# Patient Record
Sex: Female | Born: 1935 | Race: White | Hispanic: No | Marital: Married | Smoking: Never smoker
Health system: Southern US, Community
[De-identification: ages and names within clinical notes are randomized; demographics above are authoritative.]

## PROBLEM LIST (undated history)

## (undated) DIAGNOSIS — F329 Major depressive disorder, single episode, unspecified: Secondary | ICD-10-CM

## (undated) DIAGNOSIS — C50919 Malignant neoplasm of unspecified site of unspecified female breast: Secondary | ICD-10-CM

## (undated) DIAGNOSIS — G309 Alzheimer's disease, unspecified: Secondary | ICD-10-CM

## (undated) DIAGNOSIS — F0390 Unspecified dementia without behavioral disturbance: Secondary | ICD-10-CM

## (undated) DIAGNOSIS — C801 Malignant (primary) neoplasm, unspecified: Secondary | ICD-10-CM

## (undated) DIAGNOSIS — F0280 Dementia in other diseases classified elsewhere without behavioral disturbance: Secondary | ICD-10-CM

## (undated) DIAGNOSIS — F32A Depression, unspecified: Secondary | ICD-10-CM

## (undated) DIAGNOSIS — F419 Anxiety disorder, unspecified: Secondary | ICD-10-CM

## (undated) HISTORY — PX: NO PAST SURGERIES: SHX2092

---

## 2015-04-17 ENCOUNTER — Encounter: Payer: Self-pay | Admitting: Intensive Care

## 2015-04-17 ENCOUNTER — Emergency Department
Admission: EM | Admit: 2015-04-17 | Discharge: 2015-04-17 | Disposition: A | Discharge status: Home / Self Care | Payer: Medicare Other | Attending: Emergency Medicine | Admitting: Emergency Medicine

## 2015-04-17 DIAGNOSIS — Y9389 Activity, other specified: Secondary | ICD-10-CM | POA: Diagnosis not present

## 2015-04-17 DIAGNOSIS — F0390 Unspecified dementia without behavioral disturbance: Secondary | ICD-10-CM | POA: Insufficient documentation

## 2015-04-17 DIAGNOSIS — S300XXA Contusion of lower back and pelvis, initial encounter: Secondary | ICD-10-CM | POA: Diagnosis not present

## 2015-04-17 DIAGNOSIS — S3992XA Unspecified injury of lower back, initial encounter: Secondary | ICD-10-CM | POA: Diagnosis present

## 2015-04-17 DIAGNOSIS — W19XXXA Unspecified fall, initial encounter: Secondary | ICD-10-CM

## 2015-04-17 DIAGNOSIS — R296 Repeated falls: Secondary | ICD-10-CM | POA: Diagnosis not present

## 2015-04-17 DIAGNOSIS — Y9289 Other specified places as the place of occurrence of the external cause: Secondary | ICD-10-CM | POA: Insufficient documentation

## 2015-04-17 DIAGNOSIS — Y998 Other external cause status: Secondary | ICD-10-CM | POA: Insufficient documentation

## 2015-04-17 DIAGNOSIS — W1839XA Other fall on same level, initial encounter: Secondary | ICD-10-CM | POA: Diagnosis not present

## 2015-04-17 DIAGNOSIS — I10 Essential (primary) hypertension: Secondary | ICD-10-CM | POA: Insufficient documentation

## 2015-04-17 HISTORY — DX: Alzheimer's disease, unspecified: G30.9

## 2015-04-17 HISTORY — DX: Depression, unspecified: F32.A

## 2015-04-17 HISTORY — DX: Malignant neoplasm of unspecified site of unspecified female breast: C50.919

## 2015-04-17 HISTORY — DX: Major depressive disorder, single episode, unspecified: F32.9

## 2015-04-17 HISTORY — DX: Unspecified dementia, unspecified severity, without behavioral disturbance, psychotic disturbance, mood disturbance, and anxiety: F03.90

## 2015-04-17 HISTORY — DX: Dementia in other diseases classified elsewhere, unspecified severity, without behavioral disturbance, psychotic disturbance, mood disturbance, and anxiety: F02.80

## 2015-04-17 HISTORY — DX: Malignant (primary) neoplasm, unspecified: C80.1

## 2015-04-17 HISTORY — DX: Anxiety disorder, unspecified: F41.9

## 2015-04-17 LAB — GLUCOSE, CAPILLARY: Glucose-Capillary: 126 mg/dL — ABNORMAL HIGH (ref 65–99)

## 2015-04-17 NOTE — ED Notes (Signed)
Patient arrived by EMS due to unwitnessed fall stated by staff. Staff found patient on floor. Patient has HX of dementia. Per staff patient is not on blood thinners

## 2015-04-17 NOTE — ED Notes (Signed)
POC CBG 126

## 2015-04-17 NOTE — ED Notes (Signed)
PAtient has HX of dementia. NAD noted. Patient ambulated in room with assistance (RN and MD) with no problems. Staff from [employer] reports finding patient on floor with unwitnessed fall. Patient does not take blood thinners. No signs of injury reported

## 2015-04-17 NOTE — Discharge Instructions (Signed)
It appears he had a fall today without any serious injury.  You have been seen in the Emergency Department (ED) today for a fall.  Your work up does not show any concerning injuries.    Please follow up with your doctor regarding today's Emergency Department (ED) visit and your recent fall.    Return to the ED if you have any headache, confusion, slurred speech, weakness/numbness of any arm or leg, or any increased pain.   Fall Prevention in Hospitals, Adult As a hospital patient, your condition and the treatments you receive can increase your risk for falls. Some additional risk factors for falls in a hospital include:  Being in an unfamiliar environment.  Being on bed rest.  Your surgery.  Taking certain medicines.  Your tubing requirements, such as intravenous (IV) therapy or catheters. It is important that you learn how to decrease fall risks while at the hospital. Below are important tips that can help prevent falls. SAFETY TIPS FOR PREVENTING FALLS Talk about your risk of falling.  Ask your health care provider why you are at risk for falling. Is it your medicine, illness, tubing placement, or something else?  Make a plan with your health care provider to keep you safe from falls.  Ask your health care provider or pharmacist about side effects of your medicines. Some medicines can make you dizzy or affect your coordination. Ask for help.  Ask for help before getting out of bed. You may need to press your call button.  Ask for assistance in getting safely to the toilet.  Ask for a walker or cane to be put at your bedside. Ask that most of the side rails on your bed be placed up before your health care provider leaves the room.  Ask family or friends to sit with you.  Ask for things that are out of your reach, such as your glasses, hearing aids, telephone, bedside table, or call button. Follow these tips to avoid falling:  Stay lying or seated, rather than standing,  while waiting for help.  Wear rubber-soled slippers or shoes whenever you walk in the hospital.  Avoid quick, sudden movements.  Change positions slowly.  Sit on the side of your bed before standing.  Stand up slowly and wait before you start to walk.  Let your health care provider know if there is a spill on the floor.  Pay careful attention to the medical equipment, electrical cords, and tubes around you.  When you need help, use your call button by your bed or in the bathroom. Wait for one of your health care providers to help you.  If you feel dizzy or unsure of your footing, return to bed and wait for assistance.  Avoid being distracted by the TV, telephone, or another person in your room.  Do not lean or support yourself on rolling objects, such as IV poles or bedside tables.   This information is not intended to replace advice given to you by your health care provider. Make sure you discuss any questions you have with your health care provider.   Document Released: 06/17/2000 Document Revised: 07/11/2014 Document Reviewed: 02/26/2012 Elsevier Interactive Patient Education [employer].

## 2015-04-17 NOTE — ED Provider Notes (Signed)
[facility] Emergency Department Provider Note REMINDER - THIS NOTE IS NOT A FINAL MEDICAL RECORD UNTIL IT IS SIGNED. UNTIL THEN, THE CONTENT BELOW MAY REFLECT INFORMATION FROM A DOCUMENTATION TEMPLATE, NOT THE ACTUAL PATIENT VISIT. ____________________________________________  Time seen: Approximately 1:44 PM  I have reviewed the triage vital signs and the nursing notes.   HISTORY  Chief Complaint Fall  Review of systems and history are limited due to patient's severe dementia  HPI Catherine Frazier is a 81 y.o. female with a history of some very severe dementia, hypertension, occasional anxiety, and frequent falls.  History is provided by EMS as well as discussed with Olivia Mackie, one of the nurses aides who found her at [place].  The patient was found sitting next to her couch on the floor today. EMS reports it would appear that she may have slid off the couch, the patient seems to vaguely recall possibly sliding off the couch. The patient initially complained of having some discomfort in her back when I try to stand her up which prompted a call EMS.  The patient's son is present at bedside with me, he reports his mother has severe dementia the point that time she does not even remember her own name. She walks with a walker. He reports that she seems to be in her normal state at this time, and that is acting appropriately for her dementia.  No report of any high fall, no report of any loss of consciousness, no recent fevers, or other concerns reported by the patient.   Past Medical History  Diagnosis Date  . Dementia   . Anxiety   . Cancer ([place])   . Breast cancer ([place])   . Alzheimer's dementia   . Depression     There are no active problems to display for this patient.   History reviewed. No pertinent past surgical history.  No current outpatient prescriptions on file.  Allergies Review of patient's allergies indicates no known  allergies.  History reviewed. No pertinent family history.  Social History Social History  Substance Use Topics  . Smoking status: Never Smoker   . Smokeless tobacco: Never Used  . Alcohol Use: No    Review of Systems severely limited due to dementia, the patient does deny pain or discomfort at this time. She reports "I am an old lady", but otherwise no significant statements from the patient or concerns on review of system.   Patient's son reports that the patient does use a walker and occasionally falls, is not aware of any recent concerns. Again she is acting very normally. The patient does specifically deny any headache, chest pain, or pain in her arms or legs.  ____________________________________________   PHYSICAL EXAM:  VITAL SIGNS: ED Triage Vitals  Enc Vitals Group     BP 04/17/15 1327 141/59 mmHg     Pulse Rate 04/17/15 1327 76     Resp 04/17/15 1327 23     Temp 04/17/15 1327 98 F (36.7 C)     Temp Source 04/17/15 1327 Oral     SpO2 04/17/15 1327 96 %     Weight 04/17/15 1327 166 lb 9.6 oz (75.569 kg)     Height 04/17/15 1327 5\' 7"  (1.702 m)     Head Cir --      Peak Flow --      Pain Score --      Pain Loc --      Pain Edu? --  Excl. in [place]? --    Constitutional: Alert and  disoriented . Well appearing and in no acute distress. sitting upright, in no distress.  Eyes: Conjunctivae are normal. PERRL. EOMI. Head: Atraumatic. Nose: No congestion/rhinnorhea. Mouth/Throat: Mucous membranes are moist.  Oropharynx non-erythematous. Neck: No stridor.  No midline cervical tenderness. No deformities patient does have significant lordosis. Cardiovascular: Normal rate, regular rhythm. Grossly normal heart sounds.  Good peripheral circulation. Respiratory: Normal respiratory effort.  No retractions. Lungs CTAB. Gastrointestinal: Soft and nontender. No distention. No abdominal bruits. No CVA tenderness. Musculoskeletal: No lower extremity tenderness nor edema.  No  joint effusions. no pain in the legs or hips with axial loading, good range of motion of lower extremities without tenderness. No evidence of injury the upper extremities.  Neurologic:  Normal speech and language. No gross focal neurologic deficits are appreciated.  moves all extremities well. Patient is able to stand without pain, rotator cuff the bed and walks with assistance without discomfort or pain. in:  Skin is warm, dry and intact. No rash noted. presented very small ecchymosis noted over the left posterior paraspinous region without laceration.  Psychiatric: Mood and affect are normal. Speech and behavior are normal.  ____________________________________________   LABS (all labs ordered are listed, but only abnormal results are displayed)  Labs Reviewed  GLUCOSE, CAPILLARY - Abnormal; Notable for the following:    Glucose-Capillary 126 (*)    All other components within normal limits  CBG MONITORING, ED   ____________________________________________  EKG  ED ECG REPORT I, Camiya Vinal, the attending physician, personally viewed and interpreted this ECG.  Date: 04/17/2015 EKG Time: 1340 Rate: 75 Rhythm: normal sinus rhythm QRS Axis: normal Intervals: normal ST/T Wave abnormalities: normal Conduction Disutrbances: voltage criteria for LVH Narrative Interpretation: Normal sinus rhythm, possible LVH, no ischemic changes  ____________________________________________  RADIOLOGY  No indication for head CT at this time. I did discuss imaging studies and head CT with the patient's son, there is no evidence of trauma. He reports that she seems to be acting fine, she is not on any anticoagulants per my review, and without any evidence of head trauma, report a headache, and a reassuring neurologic exam I see no reason for CT of that at this time.  ____________________________________________   PROCEDURES  Procedure(s) performed: None  Critical Care performed:  No  ____________________________________________   INITIAL IMPRESSION / ASSESSMENT AND PLAN / ED COURSE  Pertinent labs & imaging results that were available during my care of the patient were reviewed by me and considered in my medical decision making (see chart for details).  Patient had a fall. No evidence of apparent injury except for a slight contusion over the left back. There is no cervical or thoracic tenderness. She is able to ambulate well without discomfort. Her vital signs are normal, no indication for CT head at this time, her EKG is normal. I discussed with the patient's son who is her next of kin, he advises that he is comfortable taking the patient back home and if any concerns arise he'll bring her back for further evaluation. I think this is very reasonable, likely this represents mechanical fall without any significant injury.  ____________________________________________   FINAL CLINICAL IMPRESSION(S) / ED DIAGNOSES  Final diagnoses:  Fall from standing, initial encounter      Delman Kitten, MD 04/17/15 1526

## 2015-04-30 ENCOUNTER — Emergency Department: Payer: Medicare Other

## 2015-04-30 ENCOUNTER — Encounter: Payer: Self-pay | Admitting: Emergency Medicine

## 2015-04-30 ENCOUNTER — Emergency Department
Admission: EM | Admit: 2015-04-30 | Discharge: 2015-04-30 | Disposition: A | Discharge status: Home / Self Care | Payer: Medicare Other | Attending: Emergency Medicine | Admitting: Emergency Medicine

## 2015-04-30 DIAGNOSIS — G309 Alzheimer's disease, unspecified: Secondary | ICD-10-CM | POA: Insufficient documentation

## 2015-04-30 DIAGNOSIS — S0083XA Contusion of other part of head, initial encounter: Secondary | ICD-10-CM | POA: Insufficient documentation

## 2015-04-30 DIAGNOSIS — Y9289 Other specified places as the place of occurrence of the external cause: Secondary | ICD-10-CM | POA: Insufficient documentation

## 2015-04-30 DIAGNOSIS — W19XXXA Unspecified fall, initial encounter: Secondary | ICD-10-CM

## 2015-04-30 DIAGNOSIS — F0280 Dementia in other diseases classified elsewhere without behavioral disturbance: Secondary | ICD-10-CM | POA: Insufficient documentation

## 2015-04-30 DIAGNOSIS — S0990XA Unspecified injury of head, initial encounter: Secondary | ICD-10-CM | POA: Diagnosis present

## 2015-04-30 DIAGNOSIS — Z79899 Other long term (current) drug therapy: Secondary | ICD-10-CM | POA: Diagnosis not present

## 2015-04-30 DIAGNOSIS — Y9389 Activity, other specified: Secondary | ICD-10-CM | POA: Insufficient documentation

## 2015-04-30 DIAGNOSIS — W01198A Fall on same level from slipping, tripping and stumbling with subsequent striking against other object, initial encounter: Secondary | ICD-10-CM | POA: Diagnosis not present

## 2015-04-30 DIAGNOSIS — S0081XA Abrasion of other part of head, initial encounter: Secondary | ICD-10-CM | POA: Diagnosis not present

## 2015-04-30 DIAGNOSIS — Y998 Other external cause status: Secondary | ICD-10-CM | POA: Diagnosis not present

## 2015-04-30 NOTE — Discharge Instructions (Signed)
Fall Prevention in Hospitals, Adult As a hospital patient, your condition and the treatments you receive can increase your risk for falls. Some additional risk factors for falls in a hospital include:  Being in an unfamiliar environment.  Being on bed rest.  Your surgery.  Taking certain medicines.  Your tubing requirements, such as intravenous (IV) therapy or catheters. It is important that you learn how to decrease fall risks while at the hospital. Below are important tips that can help prevent falls. SAFETY TIPS FOR PREVENTING FALLS Talk about your risk of falling.  Ask your health care provider why you are at risk for falling. Is it your medicine, illness, tubing placement, or something else?  Make a plan with your health care provider to keep you safe from falls.  Ask your health care provider or pharmacist about side effects of your medicines. Some medicines can make you dizzy or affect your coordination. Ask for help.  Ask for help before getting out of bed. You may need to press your call button.  Ask for assistance in getting safely to the toilet.  Ask for a walker or cane to be put at your bedside. Ask that most of the side rails on your bed be placed up before your health care provider leaves the room.  Ask family or friends to sit with you.  Ask for things that are out of your reach, such as your glasses, hearing aids, telephone, bedside table, or call button. Follow these tips to avoid falling:  Stay lying or seated, rather than standing, while waiting for help.  Wear rubber-soled slippers or shoes whenever you walk in the hospital.  Avoid quick, sudden movements.  Change positions slowly.  Sit on the side of your bed before standing.  Stand up slowly and wait before you start to walk.  Let your health care provider know if there is a spill on the floor.  Pay careful attention to the medical equipment, electrical cords, and tubes around you.  When you  need help, use your call button by your bed or in the bathroom. Wait for one of your health care providers to help you.  If you feel dizzy or unsure of your footing, return to bed and wait for assistance.  Avoid being distracted by the TV, telephone, or another person in your room.  Do not lean or support yourself on rolling objects, such as IV poles or bedside tables.   This information is not intended to replace advice given to you by your health care provider. Make sure you discuss any questions you have with your health care provider.   Document Released: 06/17/2000 Document Revised: 07/11/2014 Document Reviewed: 02/26/2012 Elsevier Interactive Patient Education [employer]. Please return for any further problems

## 2015-04-30 NOTE — ED Provider Notes (Signed)
[facility] Emergency Department Provider Note  ____________________________________________  Time seen: Approximately 11:42 AM  I have reviewed the triage vital signs and the nursing notes.   HISTORY  Chief Complaint Fall  history limited by dementia  HPI Catherine Frazier is a 81 y.o. female patient brought from nursing home with report that she fell. Patient does not complain of any pain. Patient has severe dementia and cannot tell me what if anything she hit. I am unaware of what time she may have fallen.Patient does appear to have a small bruise and abrasion on her forehead.   Past Medical History  Diagnosis Date  . Dementia   . Anxiety   . Cancer ([place])   . Breast cancer ([place])   . Alzheimer's dementia   . Depression     There are no active problems to display for this patient.   History reviewed. No pertinent past surgical history.  Current Outpatient Rx  Name  Route  Sig  Dispense  Refill  . LORazepam (ATIVAN) 0.5 MG tablet   Oral   Take 0.25 mg by mouth every 6 (six) hours as needed for anxiety.         . mirtazapine (REMERON) 15 MG tablet   Oral   Take 15 mg by mouth at bedtime.         . sertraline (ZOLOFT) 100 MG tablet   Oral   Take 100 mg by mouth at bedtime.           Allergies Review of patient's allergies indicates no known allergies.  History reviewed. No pertinent family history.  Social History Social History  Substance Use Topics  . Smoking status: Never Smoker   . Smokeless tobacco: Never Used  . Alcohol Use: No    Review of Systems Constitutional: No fever/chills Eyes: No visual changes. ENT: No sore throat. Cardiovascular: Denies chest pain. Respiratory: Denies shortness of breath. Gastrointestinal: No abdominal pain.  No nausea, no vomiting.  No diarrhea.  No constipation. Genitourinary: Negative for dysuria. Musculoskeletal: Negative for back pain. Skin: Negative for rash. Neurological: Negative  for headaches, focal weakness or numbness.  10-point ROS otherwise negative.  ____________________________________________   PHYSICAL EXAM:  VITAL SIGNS: ED Triage Vitals  Enc Vitals Group     BP 04/30/15 1127 152/68 mmHg     Pulse Rate 04/30/15 1127 64     Resp 04/30/15 1127 18     Temp 04/30/15 1127 97.7 F (36.5 C)     Temp Source 04/30/15 1127 Oral     SpO2 04/30/15 1127 100 %     Weight 04/30/15 1127 158 lb 6.4 oz (71.85 kg)     Height 04/30/15 1127 5\' 4"  (1.626 m)     Head Cir --      Peak Flow --      Pain Score --      Pain Loc --      Pain Edu? --      Excl. in [place]? --     Constitutional: Alert  Well appearing and in no acute distress. Eyes: Conjunctivae are normal. PERRL. EOMI. Head: Atraumatic. Except for above-noted abrasion and bruise on the forehead. Nose: No congestion/rhinnorhea. Mouth/Throat: Mucous membranes are moist.  Oropharynx non-erythematous. Neck: No stridor.  No cervical spine tenderness to palpation. Cardiovascular: Normal rate, regular rhythm. Grossly normal heart sounds.  Good peripheral circulation. Respiratory: Normal respiratory effort.  No retractions. Lungs CTAB. Gastrointestinal: Soft and nontender. No distention. No abdominal bruits. No CVA tenderness.  Musculoskeletal: No lower extremity tenderness nor edema.  No joint effusions. Neurologic:  Normal speech and language. No gross focal neurologic deficits are appreciated. No gait instability. Patient does walk without any difficulty Skin:  Skin is warm, dry and intact. No rash noted.   ____________________________________________   LABS (all labs ordered are listed, but only abnormal results are displayed)  Labs Reviewed - No data to display ____________________________________________  EKG  EKG read and interpreted by me shows normal sinus rhythm a rate of 60. Patient has normal axis there are no acute ST-T wave  changes ____________________________________________  RADIOLOGY   ____________________________________________   PROCEDURES    ____________________________________________   INITIAL IMPRESSION / ASSESSMENT AND PLAN / ED COURSE  Pertinent labs & imaging results that were available during my care of the patient were reviewed by me and considered in my medical decision making (see chart for details).   ____________________________________________   FINAL CLINICAL IMPRESSION(S) / ED DIAGNOSES  Final diagnoses:  Fall, initial encounter      Nena Polio, MD 04/30/15 [phone]

## 2015-04-30 NOTE — ED Notes (Signed)
This nurse and Jonni Sanger, tech walked patient around room with minimal assist.  Pt denies any pain during ambulation.

## 2015-04-30 NOTE — ED Notes (Signed)
Spoke with Wynne Dust from [facility] who said son is on his way and will be here around 1500 to take patient back.

## 2015-04-30 NOTE — ED Notes (Signed)
Pt to ED from [facility] via EMS c/o fall.  EMS states pt found on floor leaning against wall by visitor, unwitnessed.  Pt from dementia unit.  Denies pain at this time, no bruising or deformities noted.  Pt extremities strong and no drift noted x4.  Pt is alert, disoriented to place and situation and time, oriented to person.

## 2015-04-30 NOTE — ED Notes (Signed)
Pt ambulated with assistance to bathroom.

## 2015-05-04 ENCOUNTER — Encounter: Payer: Self-pay | Admitting: Emergency Medicine

## 2015-05-04 ENCOUNTER — Emergency Department: Payer: Medicare Other

## 2015-05-04 ENCOUNTER — Emergency Department
Admission: EM | Admit: 2015-05-04 | Discharge: 2015-05-04 | Disposition: A | Discharge status: Home / Self Care | Payer: Medicare Other | Attending: Student | Admitting: Student

## 2015-05-04 DIAGNOSIS — W1839XA Other fall on same level, initial encounter: Secondary | ICD-10-CM | POA: Diagnosis not present

## 2015-05-04 DIAGNOSIS — G309 Alzheimer's disease, unspecified: Secondary | ICD-10-CM | POA: Diagnosis not present

## 2015-05-04 DIAGNOSIS — F0280 Dementia in other diseases classified elsewhere without behavioral disturbance: Secondary | ICD-10-CM | POA: Insufficient documentation

## 2015-05-04 DIAGNOSIS — Z79899 Other long term (current) drug therapy: Secondary | ICD-10-CM | POA: Diagnosis not present

## 2015-05-04 DIAGNOSIS — Y92129 Unspecified place in nursing home as the place of occurrence of the external cause: Secondary | ICD-10-CM | POA: Insufficient documentation

## 2015-05-04 DIAGNOSIS — Y998 Other external cause status: Secondary | ICD-10-CM | POA: Diagnosis not present

## 2015-05-04 DIAGNOSIS — Y9389 Activity, other specified: Secondary | ICD-10-CM | POA: Diagnosis not present

## 2015-05-04 DIAGNOSIS — W19XXXA Unspecified fall, initial encounter: Secondary | ICD-10-CM

## 2015-05-04 DIAGNOSIS — S79911A Unspecified injury of right hip, initial encounter: Secondary | ICD-10-CM | POA: Insufficient documentation

## 2015-05-04 DIAGNOSIS — S79912A Unspecified injury of left hip, initial encounter: Secondary | ICD-10-CM | POA: Diagnosis present

## 2015-05-04 DIAGNOSIS — R600 Localized edema: Secondary | ICD-10-CM | POA: Insufficient documentation

## 2015-05-04 MED ORDER — ACETAMINOPHEN 325 MG PO TABS
650.0000 mg | ORAL_TABLET | Freq: Once | ORAL | Status: AC
  Administered 2015-05-04: 650 mg via ORAL
  Filled 2015-05-04: qty 2

## 2015-05-04 MED ORDER — IBUPROFEN 400 MG PO TABS
400.0000 mg | ORAL_TABLET | Freq: Once | ORAL | Status: AC
  Administered 2015-05-04: 400 mg via ORAL
  Filled 2015-05-04: qty 1

## 2015-05-04 NOTE — ED Notes (Signed)
Pt ambulatory with walker without difficulty. 

## 2015-05-04 NOTE — ED Notes (Signed)
Pt arrived via EMS from [facility] found on the floor in "crab walk" position with hip pain.

## 2015-05-04 NOTE — ED Provider Notes (Signed)
[facility] Emergency Department Provider Note  ____________________________________________  Time seen: Approximately 7:11 PM  I have reviewed the triage vital signs and the nursing notes.   HISTORY  Chief Complaint Fall  Caveat- HPI and ROS limited secondary to the patient's dementia. Information obtained from family at bedside.  HPI Catherine Frazier is a 81 y.o. female with dementia, not chronically anticoagulated who presents for evaluation of pain after unwitnessed fall at [facility] house. Per staff, patient was in an adjacent room and fell. Staff heard her scream and immediately attended to her. She was found on the floor but her walker was not nearby. Staff reports that she often falls because she refuses to use her walker. She was found on the floor alert. She has otherwise been in her usual state of health without illness include no vomiting, diarrhea, fevers or chills.   Past Medical History  Diagnosis Date  . Dementia   . Anxiety   . Cancer ([place])   . Breast cancer ([place])   . Alzheimer's dementia   . Depression     There are no active problems to display for this patient.   History reviewed. No pertinent past surgical history.  Current Outpatient Rx  Name  Route  Sig  Dispense  Refill  . LORazepam (ATIVAN) 0.5 MG tablet   Oral   Take 0.25 mg by mouth every 6 (six) hours as needed for anxiety.         . mirtazapine (REMERON) 15 MG tablet   Oral   Take 15 mg by mouth at bedtime.         . sertraline (ZOLOFT) 100 MG tablet   Oral   Take 100 mg by mouth at bedtime.           Allergies Review of patient's allergies indicates no known allergies.  History reviewed. No pertinent family history.  Social History Social History  Substance Use Topics  . Smoking status: Never Smoker   . Smokeless tobacco: Never Used  . Alcohol Use: No    Review of Systems Constitutional: No fever/chills Gastrointestinal: No abdominal pain.  No  nausea, no vomiting.  No diarrhea.  No constipation.   Caveat- HPI and ROS limited secondary to the patient's dementia. Information obtained from family at bedside. ____________________________________________   PHYSICAL EXAM:  VITAL SIGNS: ED Triage Vitals  Enc Vitals Group     BP 05/04/15 1816 138/55 mmHg     Pulse Rate 05/04/15 1816 66     Resp 05/04/15 1816 20     Temp 05/04/15 1816 98.2 F (36.8 C)     Temp Source 05/04/15 1816 Oral     SpO2 05/04/15 1816 97 %     Weight 05/04/15 1816 160 lb (72.576 kg)     Height 05/04/15 1816 5\' 6"  (1.676 m)     Head Cir --      Peak Flow --      Pain Score --      Pain Loc --      Pain Edu? --      Excl. in [place]? --     Constitutional: Alert and oriented to self. [place] demented. Well appearing and in no acute distress. Eyes: Conjunctivae are normal. PERRL. EOMI. Head: Atraumatic. Nose: No congestion/rhinnorhea. Mouth/Throat: Mucous membranes are moist.  Oropharynx non-erythematous. Neck: No stridor.  No cervical spine tenderness to palpation. Cardiovascular: Normal rate, regular rhythm. Grossly normal heart sounds.  Good peripheral circulation. Respiratory: Normal respiratory effort.  No  retractions. Lungs CTAB. Gastrointestinal: Soft and nontender. No distention.  No CVA tenderness. Genitourinary: deferred Musculoskeletal: No lower extremity tenderness nor edema.  Chest atraumatic without tenderness. No midline tenderness to palpation throat the T or L-spine. Mild tenderness to palpation throughout the left hip however patient has full range of motion in bilateral hips. She has some chronic mild edema with erythema bilateral lower extremities, 2+ DP pulses in bilateral lower extremities. Mild swelling which is symmetric at bilateral ankles. Neurologic:  Normal speech and language. No gross focal neurologic deficits are appreciated. No gait instability.  Skin:  Skin is warm, dry and intact. No rash noted. Psychiatric: Mood and  affect are normal. Speech and behavior are normal.  ____________________________________________   LABS (all labs ordered are listed, but only abnormal results are displayed)  Labs Reviewed - No data to display ____________________________________________  EKG  none ____________________________________________  RADIOLOGY  Xray left hip  IMPRESSION: No acute fracture identified.  Xray left ankle IMPRESSION: 1. No acute osseous abnormality. 2. Osteoarthritis involving the visualized foot and ankle.  Xray right ankle IMPRESSION: Chronic changes without acute abnormality. ____________________________________________   PROCEDURES  Procedure(s) performed: None  Critical Care performed: No  ____________________________________________   INITIAL IMPRESSION / ASSESSMENT AND PLAN / ED COURSE  Pertinent labs & imaging results that were available during my care of the patient were reviewed by me and considered in my medical decision making (see chart for details).  Catherine Frazier is a 81 y.o. female with dementia, not chronically anticoagulated who presents for evaluation of pain after unwitnessed fall at [facility] house. On exam, she is very well-appearing and in no acute distress. Vital signs stable, she is afebrile. Plan films negative for any acute fracture dislocation or bony abnormality. She ambulates well with walker. No evidence of head injury, no tenderness throughout the C-spine and I do not think she requires imaging of the CT head or C-spine. Suspect that she had a mechanical fall the setting of not using her walker. Discussed with [facility] that she will return to them. Discussed return precautions with her son at bedside and she'll need for close PCP follow-up and all are comfortable with the discharge plan. ____________________________________________   FINAL CLINICAL IMPRESSION(S) / ED DIAGNOSES  Final diagnoses:  Fall, initial encounter      Joanne Gavel, MD 05/04/15 2114

## 2015-05-04 NOTE — ED Notes (Signed)
Pt returns from Xray.

## 2015-05-09 ENCOUNTER — Emergency Department: Payer: Medicare Other

## 2015-05-09 ENCOUNTER — Emergency Department
Admission: EM | Admit: 2015-05-09 | Discharge: 2015-05-09 | Disposition: A | Discharge status: Home / Self Care | Payer: Medicare Other | Attending: Emergency Medicine | Admitting: Emergency Medicine

## 2015-05-09 DIAGNOSIS — I872 Venous insufficiency (chronic) (peripheral): Secondary | ICD-10-CM | POA: Insufficient documentation

## 2015-05-09 DIAGNOSIS — S79911A Unspecified injury of right hip, initial encounter: Secondary | ICD-10-CM | POA: Insufficient documentation

## 2015-05-09 DIAGNOSIS — Y998 Other external cause status: Secondary | ICD-10-CM | POA: Insufficient documentation

## 2015-05-09 DIAGNOSIS — G309 Alzheimer's disease, unspecified: Secondary | ICD-10-CM | POA: Insufficient documentation

## 2015-05-09 DIAGNOSIS — W1839XA Other fall on same level, initial encounter: Secondary | ICD-10-CM | POA: Diagnosis not present

## 2015-05-09 DIAGNOSIS — Y9289 Other specified places as the place of occurrence of the external cause: Secondary | ICD-10-CM | POA: Diagnosis not present

## 2015-05-09 DIAGNOSIS — F0280 Dementia in other diseases classified elsewhere without behavioral disturbance: Secondary | ICD-10-CM | POA: Insufficient documentation

## 2015-05-09 DIAGNOSIS — Y9389 Activity, other specified: Secondary | ICD-10-CM | POA: Diagnosis not present

## 2015-05-09 DIAGNOSIS — Z79899 Other long term (current) drug therapy: Secondary | ICD-10-CM | POA: Diagnosis not present

## 2015-05-09 DIAGNOSIS — W19XXXA Unspecified fall, initial encounter: Secondary | ICD-10-CM

## 2015-05-09 DIAGNOSIS — N390 Urinary tract infection, site not specified: Secondary | ICD-10-CM | POA: Insufficient documentation

## 2015-05-09 DIAGNOSIS — R410 Disorientation, unspecified: Secondary | ICD-10-CM | POA: Insufficient documentation

## 2015-05-09 LAB — CBC WITH DIFFERENTIAL/PLATELET
BASOS PCT: 1 %
Basophils Absolute: 0.1 10*3/uL (ref 0–0.1)
Eosinophils Absolute: 0.3 10*3/uL (ref 0–0.7)
Eosinophils Relative: 3 %
HEMATOCRIT: 35.1 % (ref 35.0–47.0)
Hemoglobin: 11.7 g/dL — ABNORMAL LOW (ref 12.0–16.0)
LYMPHS PCT: 18 %
Lymphs Abs: 1.4 10*3/uL (ref 1.0–3.6)
MCH: 29.3 pg (ref 26.0–34.0)
MCHC: 33.2 g/dL (ref 32.0–36.0)
MCV: 88.1 fL (ref 80.0–100.0)
MONO ABS: 0.8 10*3/uL (ref 0.2–0.9)
Monocytes Relative: 10 %
NEUTROS ABS: 5.3 10*3/uL (ref 1.4–6.5)
NEUTROS PCT: 68 %
Platelets: 217 10*3/uL (ref 150–440)
RBC: 3.99 MIL/uL (ref 3.80–5.20)
RDW: 16.3 % — AB (ref 11.5–14.5)
WBC: 7.8 10*3/uL (ref 3.6–11.0)

## 2015-05-09 LAB — URINALYSIS COMPLETE WITH MICROSCOPIC (ARMC ONLY)
BILIRUBIN URINE: NEGATIVE
GLUCOSE, UA: NEGATIVE mg/dL
HGB URINE DIPSTICK: NEGATIVE
Ketones, ur: NEGATIVE mg/dL
NITRITE: POSITIVE — AB
Protein, ur: NEGATIVE mg/dL
SPECIFIC GRAVITY, URINE: 1.017 (ref 1.005–1.030)
pH: 6 (ref 5.0–8.0)

## 2015-05-09 LAB — COMPREHENSIVE METABOLIC PANEL
ALBUMIN: 3.1 g/dL — AB (ref 3.5–5.0)
ALT: 8 U/L — ABNORMAL LOW (ref 14–54)
ANION GAP: 1 — AB (ref 5–15)
AST: 19 U/L (ref 15–41)
Alkaline Phosphatase: 89 U/L (ref 38–126)
BILIRUBIN TOTAL: 0.6 mg/dL (ref 0.3–1.2)
BUN: 23 mg/dL — ABNORMAL HIGH (ref 6–20)
CO2: 29 mmol/L (ref 22–32)
Calcium: 8.5 mg/dL — ABNORMAL LOW (ref 8.9–10.3)
Chloride: 111 mmol/L (ref 101–111)
Creatinine, Ser: 0.92 mg/dL (ref 0.44–1.00)
GFR calc Af Amer: >60 mL/min (ref >60)
GFR, EST NON AFRICAN AMERICAN: 54 mL/min — AB (ref >60)
GLUCOSE: 110 mg/dL — AB (ref 65–99)
POTASSIUM: 4.2 mmol/L (ref 3.5–5.1)
Sodium: 141 mmol/L (ref 135–145)
TOTAL PROTEIN: 6.2 g/dL — AB (ref 6.5–8.1)

## 2015-05-09 LAB — TROPONIN I

## 2015-05-09 MED ORDER — NITROFURANTOIN MONOHYD MACRO 100 MG PO CAPS: 100.0000 mg | ORAL_CAPSULE | Freq: Two times a day (BID) | ORAL | Status: DC

## 2015-05-09 MED ORDER — DEXTROSE 5 % IV SOLN
1.0000 g | INTRAVENOUS | Status: DC
  Administered 2015-05-09: 1 g via INTRAVENOUS
  Filled 2015-05-09: qty 10

## 2015-05-09 NOTE — ED Notes (Signed)
Unwitnessed fall lives at [facility], husband that rooms with him alerted staff.  Pt has hx of dementia.

## 2015-05-09 NOTE — ED Notes (Signed)
Patient was being monitored via heart monitor, had both side rails up, and had call light attached to bed rail. Patient removed monitoring cables, slid to end of bed and walked down hallway to nursing station. Patient has h/o fall x4 this year. Charge notified, sitter assigned to patient. Patient returned to bed without injury.

## 2015-05-09 NOTE — Discharge Instructions (Signed)
1. Take antibiotic as prescribed (Macrobid 100 mg twice daily 7 days). 2. Return to the ER for worsening symptoms, persistent vomiting, difficulty breathing or other concerns.  Fall Prevention in Hospitals, Adult As a hospital patient, your condition and the treatments you receive can increase your risk for falls. Some additional risk factors for falls in a hospital include:  Being in an unfamiliar environment.  Being on bed rest.  Your surgery.  Taking certain medicines.  Your tubing requirements, such as intravenous (IV) therapy or catheters. It is important that you learn how to decrease fall risks while at the hospital. Below are important tips that can help prevent falls. SAFETY TIPS FOR PREVENTING FALLS Talk about your risk of falling.  Ask your health care provider why you are at risk for falling. Is it your medicine, illness, tubing placement, or something else?  Make a plan with your health care provider to keep you safe from falls.  Ask your health care provider or pharmacist about side effects of your medicines. Some medicines can make you dizzy or affect your coordination. Ask for help.  Ask for help before getting out of bed. You may need to press your call button.  Ask for assistance in getting safely to the toilet.  Ask for a walker or cane to be put at your bedside. Ask that most of the side rails on your bed be placed up before your health care provider leaves the room.  Ask family or friends to sit with you.  Ask for things that are out of your reach, such as your glasses, hearing aids, telephone, bedside table, or call button. Follow these tips to avoid falling:  Stay lying or seated, rather than standing, while waiting for help.  Wear rubber-soled slippers or shoes whenever you walk in the hospital.  Avoid quick, sudden movements.  Change positions slowly.  Sit on the side of your bed before standing.  Stand up slowly and wait before you start to  walk.  Let your health care provider know if there is a spill on the floor.  Pay careful attention to the medical equipment, electrical cords, and tubes around you.  When you need help, use your call button by your bed or in the bathroom. Wait for one of your health care providers to help you.  If you feel dizzy or unsure of your footing, return to bed and wait for assistance.  Avoid being distracted by the TV, telephone, or another person in your room.  Do not lean or support yourself on rolling objects, such as IV poles or bedside tables.   This information is not intended to replace advice given to you by your health care provider. Make sure you discuss any questions you have with your health care provider.   Document Released: 06/17/2000 Document Revised: 07/11/2014 Document Reviewed: 02/26/2012 Elsevier Interactive Patient Education 2016 Elsevier Inc.  Urinary Tract Infection Urinary tract infections (UTIs) can develop anywhere along your urinary tract. Your urinary tract is your body's drainage system for removing wastes and extra water. Your urinary tract includes two kidneys, two ureters, a bladder, and a urethra. Your kidneys are a pair of bean-shaped organs. Each kidney is about the size of your fist. They are located below your ribs, one on each side of your spine. CAUSES Infections are caused by microbes, which are microscopic organisms, including fungi, viruses, and bacteria. These organisms are so small that they can only be seen through a microscope. Bacteria are the microbes  that most commonly cause UTIs. SYMPTOMS  Symptoms of UTIs may vary by age and gender of the patient and by the location of the infection. Symptoms in young women typically include a frequent and intense urge to urinate and a painful, burning feeling in the bladder or urethra during urination. Older women and men are more likely to be tired, shaky, and weak and have muscle aches and abdominal pain. A fever  may mean the infection is in your kidneys. Other symptoms of a kidney infection include pain in your back or sides below the ribs, nausea, and vomiting. DIAGNOSIS To diagnose a UTI, your caregiver will ask you about your symptoms. Your caregiver will also ask you to provide a urine sample. The urine sample will be tested for bacteria and white blood cells. White blood cells are made by your body to help fight infection. TREATMENT  Typically, UTIs can be treated with medication. Because most UTIs are caused by a bacterial infection, they usually can be treated with the use of antibiotics. The choice of antibiotic and length of treatment depend on your symptoms and the type of bacteria causing your infection. HOME CARE INSTRUCTIONS  If you were prescribed antibiotics, take them exactly as your caregiver instructs you. Finish the medication even if you feel better after you have only taken some of the medication.  Drink enough water and fluids to keep your urine clear or pale yellow.  Avoid caffeine, tea, and carbonated beverages. They tend to irritate your bladder.  Empty your bladder often. Avoid holding urine for long periods of time.  Empty your bladder before and after sexual intercourse.  After a bowel movement, women should cleanse from front to back. Use each tissue only once. SEEK MEDICAL CARE IF:   You have back pain.  You develop a fever.  Your symptoms do not begin to resolve within 3 days. SEEK IMMEDIATE MEDICAL CARE IF:   You have severe back pain or lower abdominal pain.  You develop chills.  You have nausea or vomiting.  You have continued burning or discomfort with urination. MAKE SURE YOU:   Understand these instructions.  Will watch your condition.  Will get help right away if you are not doing well or get worse.   This information is not intended to replace advice given to you by your health care provider. Make sure you discuss any questions you have with  your health care provider.   Document Released: 03/30/2005 Document Revised: 03/11/2015 Document Reviewed: 07/29/2011 Elsevier Interactive Patient Education [employer].

## 2015-05-09 NOTE — ED Provider Notes (Signed)
[facility] Emergency Department Provider Note  ____________________________________________  Time seen: Approximately 1:44 AM  I have reviewed the triage vital signs and the nursing notes.   HISTORY  Chief Complaint Fall  Limited by dementia  HPI Naleyah Ohlinger is a 81 y.o. female who presents to the ED via EMS from [place] s/p witnessed fall.Per staff, husband who rooms with her alerted staff of patient found on the floor. Of note, a review of patient's chart reveals this is her fourth fall in approximately 1-1/2 weeks. Patient arrives alert and oriented to self only; voices no complaints of pain. Rest of history is limited by patient's dementia.   Past Medical History  Diagnosis Date  . Dementia   . Anxiety   . Cancer ([place])   . Breast cancer ([place])   . Alzheimer's dementia   . Depression     There are no active problems to display for this patient.   No past surgical history on file.  Current Outpatient Rx  Name  Route  Sig  Dispense  Refill  . LORazepam (ATIVAN) 0.5 MG tablet   Oral   Take 0.25 mg by mouth every 6 (six) hours as needed for anxiety.         . mirtazapine (REMERON) 15 MG tablet   Oral   Take 15 mg by mouth at bedtime.         . sertraline (ZOLOFT) 100 MG tablet   Oral   Take 100 mg by mouth at bedtime.           Allergies Review of patient's allergies indicates no known allergies.  No family history on file.  Social History Social History  Substance Use Topics  . Smoking status: Never Smoker   . Smokeless tobacco: Never Used  . Alcohol Use: No    Review of Systems Constitutional: Positive for multiple falls. No fever/chills. Eyes: No visual changes. ENT: No sore throat. Cardiovascular: Denies chest pain. Respiratory: Denies shortness of breath. Gastrointestinal: No abdominal pain.  No nausea, no vomiting.  No diarrhea.  No constipation. Genitourinary: Negative for dysuria. Musculoskeletal:  Negative for back pain. Skin: Negative for rash. Neurological: Negative for headaches, focal weakness or numbness.  10-point ROS otherwise negative.  ____________________________________________   PHYSICAL EXAM:  VITAL SIGNS: ED Triage Vitals  Enc Vitals Group     BP 05/09/15 0135 190/71 mmHg     Pulse Rate 05/09/15 0132 66     Resp 05/09/15 0132 18     Temp 05/09/15 0132 97.5 F (36.4 C)     Temp src --      SpO2 05/09/15 0132 99 %     Weight 05/09/15 0132 167 lb (75.751 kg)     Height --      Head Cir --      Peak Flow --      Pain Score --      Pain Loc --      Pain Edu? --      Excl. in [place]? --     Constitutional: Alert and confused. Well appearing and in no acute distress. Eyes: Conjunctivae are normal. PERRL. EOMI. Head: Atraumatic. Nose: No congestion/rhinnorhea. Mouth/Throat: Mucous membranes are moist.  Oropharynx non-erythematous. Neck: No stridor.  No cervical spine tenderness to palpation.  No stepoffs or deformities noted. Cardiovascular: Normal rate, regular rhythm. Grossly normal heart sounds.  Good peripheral circulation. Respiratory: Normal respiratory effort.  No retractions. Lungs CTAB. Gastrointestinal: Soft and nontender. No distention. No  abdominal bruits. No CVA tenderness. Musculoskeletal: Pelvis stable. Patient voices no complaints of pain, but grimaces on ranging her right hip. Chronic venous stasis ulcers noted to bilateral lower extremities with nonpitting edema.  Neurologic:  Alert and oriented to self only. Normal speech and language. No gross focal neurologic deficits are appreciated.  Skin:  Skin is warm, dry and intact. No rash noted. Psychiatric: Mood and affect are normal. Speech and behavior are normal.  ____________________________________________   LABS (all labs ordered are listed, but only abnormal results are displayed)  Labs Reviewed  CBC WITH DIFFERENTIAL/PLATELET - Abnormal; Notable for the following:    Hemoglobin 11.7  (*)    RDW 16.3 (*)    All other components within normal limits  COMPREHENSIVE METABOLIC PANEL - Abnormal; Notable for the following:    Glucose, Bld 110 (*)    BUN 23 (*)    Calcium 8.5 (*)    Total Protein 6.2 (*)    Albumin 3.1 (*)    ALT 8 (*)    GFR calc non Af Amer 54 (*)    Anion gap 1 (*)    All other components within normal limits  URINALYSIS COMPLETEWITH MICROSCOPIC ([facility] ONLY) - Abnormal; Notable for the following:    Color, Urine YELLOW (*)    APPearance HAZY (*)    Nitrite POSITIVE (*)    Leukocytes, UA 3+ (*)    Bacteria, UA FEW (*)    Squamous Epithelial / LPF 0-5 (*)    All other components within normal limits  TROPONIN I   ____________________________________________  EKG  ED ECG REPORT I, Bryant Saye J, the attending physician, personally viewed and interpreted this ECG.   Date: 05/09/2015  EKG Time: 0138  Rate: 63  Rhythm: normal EKG, normal sinus rhythm  Axis: Normal  Intervals:none  ST&T Change: Nonspecific  ____________________________________________  RADIOLOGY  CT head without contrast interpreted per Dr. Autumn Patty: 1. No acute intracranial abnormalities. 2. Moderate atrophy, stable from prior study.  Chest 1 view (viewed by me, interpreted per Dr. Autumn Patty): No acute cardiopulmonary disease.  Right hip x-rays (viewed by me, interpreted per Dr. Dorann Lodge): No acute fracture deformity, dislocation or advanced degenerative change for age. ____________________________________________   PROCEDURES  Procedure(s) performed: None  Critical Care performed: No  ____________________________________________   INITIAL IMPRESSION / ASSESSMENT AND PLAN / ED COURSE  Pertinent labs & imaging results that were available during my care of the patient were reviewed by me and considered in my medical decision making (see chart for details).  81 year old female who presents to the ED s/p unwitnessed fall from [place]. This is patient's fourth  fall and ED visit in 1-1/2 weeks. Will obtain CT head, check screening lab work including troponin, obtain EKG, urinalysis and reassess. Will image right hip given patient grimaced in pain on ranging of her hip.  ----------------------------------------- 5:54 AM on 05/09/2015 -----------------------------------------  Patient resting in no acute distress. IV Rocephin ordered for UTI. Discussed with care manager who sees no criteria for hospitalization. Anticipate discharge back to facility on oral antibiotics. Strict return precautions given. Patient verbalizes understanding and agrees with plan of care. ____________________________________________   FINAL CLINICAL IMPRESSION(S) / ED DIAGNOSES  Final diagnoses:  Fall, initial encounter  UTI (lower urinary tract infection)      Paulette Blanch, MD 05/09/15 [phone]

## 2015-05-28 ENCOUNTER — Emergency Department
Admission: EM | Admit: 2015-05-28 | Discharge: 2015-05-29 | Disposition: A | Discharge status: Home / Self Care | Payer: Medicare Other | Attending: Emergency Medicine | Admitting: Emergency Medicine

## 2015-05-28 ENCOUNTER — Emergency Department: Payer: Medicare Other

## 2015-05-28 DIAGNOSIS — F0280 Dementia in other diseases classified elsewhere without behavioral disturbance: Secondary | ICD-10-CM | POA: Diagnosis not present

## 2015-05-28 DIAGNOSIS — G309 Alzheimer's disease, unspecified: Secondary | ICD-10-CM | POA: Insufficient documentation

## 2015-05-28 DIAGNOSIS — Y9389 Activity, other specified: Secondary | ICD-10-CM | POA: Diagnosis not present

## 2015-05-28 DIAGNOSIS — W19XXXA Unspecified fall, initial encounter: Secondary | ICD-10-CM

## 2015-05-28 DIAGNOSIS — S060X1A Concussion with loss of consciousness of 30 minutes or less, initial encounter: Secondary | ICD-10-CM

## 2015-05-28 DIAGNOSIS — Y92129 Unspecified place in nursing home as the place of occurrence of the external cause: Secondary | ICD-10-CM | POA: Insufficient documentation

## 2015-05-28 DIAGNOSIS — Y998 Other external cause status: Secondary | ICD-10-CM | POA: Insufficient documentation

## 2015-05-28 DIAGNOSIS — M7918 Myalgia, other site: Secondary | ICD-10-CM

## 2015-05-28 DIAGNOSIS — Z043 Encounter for examination and observation following other accident: Secondary | ICD-10-CM | POA: Diagnosis present

## 2015-05-28 DIAGNOSIS — Z79899 Other long term (current) drug therapy: Secondary | ICD-10-CM | POA: Insufficient documentation

## 2015-05-28 NOTE — ED Notes (Signed)
Pt arrived to ED via [facility] from [facility] d/t a witnessed fall about 20 ins PTA. Per EMS, faculty reports no injury to the head, no LOC. Pt points to the ABD when asked about pain. Per EMS, pt is a resident of the memory Care unit with a h/x of dementia. Pt is in NAD, alert only to self, with respirations regular, even, and unlabored. Pt appears drowsy and lethargic, but arousable to voice.

## 2015-05-28 NOTE — ED Notes (Signed)
Personal alarm attached to pt d/t High Fall Risk; bed in low position w/ wheels locked, side rails raised x2.
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
## 2015-05-29 NOTE — ED Notes (Signed)
Patient transported to CT 

## 2015-05-29 NOTE — ED Notes (Signed)
Patient returned to ED Rm23 from [place].
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
## 2015-07-23 ENCOUNTER — Emergency Department
Admission: EM | Admit: 2015-07-23 | Discharge: 2015-07-23 | Disposition: A | Discharge status: Home / Self Care | Payer: Medicare Other | Attending: Emergency Medicine | Admitting: Emergency Medicine

## 2015-07-23 DIAGNOSIS — Z0000 Encounter for general adult medical examination without abnormal findings: Secondary | ICD-10-CM

## 2015-07-23 DIAGNOSIS — Y92128 Other place in nursing home as the place of occurrence of the external cause: Secondary | ICD-10-CM | POA: Insufficient documentation

## 2015-07-23 DIAGNOSIS — Z79899 Other long term (current) drug therapy: Secondary | ICD-10-CM | POA: Diagnosis not present

## 2015-07-23 DIAGNOSIS — Y9389 Activity, other specified: Secondary | ICD-10-CM | POA: Diagnosis not present

## 2015-07-23 DIAGNOSIS — Z043 Encounter for examination and observation following other accident: Secondary | ICD-10-CM | POA: Diagnosis present

## 2015-07-23 DIAGNOSIS — F0280 Dementia in other diseases classified elsewhere without behavioral disturbance: Secondary | ICD-10-CM | POA: Diagnosis not present

## 2015-07-23 DIAGNOSIS — Y998 Other external cause status: Secondary | ICD-10-CM | POA: Diagnosis not present

## 2015-07-23 DIAGNOSIS — G309 Alzheimer's disease, unspecified: Secondary | ICD-10-CM | POA: Diagnosis not present

## 2015-07-23 DIAGNOSIS — W1839XA Other fall on same level, initial encounter: Secondary | ICD-10-CM | POA: Diagnosis not present

## 2015-07-23 DIAGNOSIS — F0390 Unspecified dementia without behavioral disturbance: Secondary | ICD-10-CM

## 2015-07-23 DIAGNOSIS — Z711 Person with feared health complaint in whom no diagnosis is made: Secondary | ICD-10-CM | POA: Diagnosis not present

## 2015-07-23 NOTE — ED Provider Notes (Signed)
[facility] Emergency Department Provider Note  ____________________________________________  Time seen: Approximately 4:29 AM  I have reviewed the triage vital signs and the nursing notes.   HISTORY  Chief Complaint Fall  History, and to a lesser extent examination, is limited due to chronic severe dementia.  HPI Catherine Frazier is a 82 y.o. female with a history of dementia who presents by EMS after what is apparently an unwitnessed fall at [place].  She seems to know that she was on the ground but is not able to provide any significant history.  She denies being in any pain at the moment, specifically including headache, neck pain, chest pain, abdominal pain, and any pain in her extremities.   Past Medical History  Diagnosis Date  . Dementia   . Anxiety   . Cancer ([place])   . Breast cancer ([place])   . Alzheimer's dementia   . Depression     There are no active problems to display for this patient.   History reviewed. No pertinent past surgical history.  Current Outpatient Rx  Name  Route  Sig  Dispense  Refill  . clobetasol cream (TEMOVATE) 0.05 %   Topical   Apply 1 application topically 2 (two) times daily as needed (dry, red skin).          . LORazepam (ATIVAN) 0.5 MG tablet   Oral   Take 0.5 mg by mouth every 6 (six) hours as needed for anxiety.          . mineral oil-hydrophilic petrolatum (AQUAPHOR) ointment   Topical   Apply 1 application topically 2 (two) times daily. Apply to legs         . mirtazapine (REMERON) 15 MG tablet   Oral   Take 15 mg by mouth at bedtime.         . sertraline (ZOLOFT) 100 MG tablet   Oral   Take 100 mg by mouth at bedtime.           Allergies Review of patient's allergies indicates no known allergies.  No family history on file.  Social History Social History  Substance Use Topics  . Smoking status: Never Smoker   . Smokeless tobacco: Never Used  . Alcohol Use: No     Review of Systems Unable to obtain due to chronic dementia  ____________________________________________   PHYSICAL EXAM:  VITAL SIGNS: ED Triage Vitals  Enc Vitals Group     BP 07/23/15 0332 162/67 mmHg     Pulse Rate 07/23/15 0332 59     Resp 07/23/15 0332 16     Temp --      Temp src --      SpO2 07/23/15 0332 99 %     Weight 07/23/15 0332 164 lb (74.39 kg)     Height 07/23/15 0332 5\' 6"  (1.676 m)     Head Cir --      Peak Flow --      Pain Score 07/23/15 0333 0     Pain Loc --      Pain Edu? --      Excl. in [place]? --     Constitutional: Awake and responds to questions but is unable to formulate meaningful responses.  She does answer yes or no to questions including whether or not she is in any pain. Eyes: Conjunctivae are normal. PERRL. EOMI. Head: Atraumatic.  No tenderness to palpation, no evidence of hematomas or bleeding. Nose: No congestion/rhinnorhea. Mouth/Throat: Mucous membranes are  moist.  Oropharynx non-erythematous. Neck: No stridor.  No cervical spine tenderness to palpation. Cardiovascular: Normal rate, regular rhythm. Grossly normal heart sounds.  Good peripheral circulation. Respiratory: Normal respiratory effort.  No retractions. Lungs CTAB. Gastrointestinal: Soft and nontender. No distention. No abdominal bruits. No CVA tenderness. Musculoskeletal: I fully ranged the patient's arms and legs and she has normal range of motion and no pain or tenderness with abduction, abduction, flexion, nor extension.  There are no gross deformities. Neurologic:  Normal speech and language. No gross focal neurologic deficits are appreciated.  Skin:  Skin is warm, dry and intact.  Chronic skin changes in the lower extremities but no evidence of active infection.   ____________________________________________   LABS (all labs ordered are listed, but only abnormal results are displayed)  Labs Reviewed - No data to  display ____________________________________________  EKG  None ____________________________________________  RADIOLOGY   No results found.  ____________________________________________   PROCEDURES  Procedure(s) performed: None  Critical Care performed: No ____________________________________________   INITIAL IMPRESSION / ASSESSMENT AND PLAN / ED COURSE  Pertinent labs & imaging results that were available during my care of the patient were reviewed by me and considered in my medical decision making (see chart for details).  The patient has no sign of any trauma and is in no pain.  After fully ranging all of her limbs she still has no pain or tenderness and I do not believe there is any indication for imaging at this time.  She is not on any blood thinners and has no traumatic injury to her head so I do not believe that it is beneficial to obtain CT scan.  Ambulates without pain with assistance.  Drinking fluids in ED.  I will discharge her back to [place] for outpatient follow-up.  ____________________________________________  FINAL CLINICAL IMPRESSION(S) / ED DIAGNOSES  Final diagnoses:  Normal physical exam  Chronic dementia without behavioral disturbance      NEW MEDICATIONS STARTED DURING THIS VISIT:  New Prescriptions   No medications on file     Hinda Kehr, MD 07/23/15 [phone]

## 2015-07-23 NOTE — ED Notes (Signed)
Pt asked for a ginger ale. Ok per Dr. Karma Greaser.

## 2015-07-23 NOTE — ED Notes (Signed)
Pt ambulated to toilet with ED tech.

## 2015-07-23 NOTE — ED Notes (Signed)
Report given [facility]

## 2015-07-23 NOTE — ED Notes (Signed)
Pt ambulated around room with this RN. Pt took small steps with grip socks on holding on to this RN. Pt ambulated well.
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
## 2015-07-23 NOTE — ED Notes (Signed)
Pt given ginger ale to drink. 

## 2015-07-23 NOTE — ED Notes (Addendum)
Pt speech is confused, able to follow commands most of time, pt will stop talking mid sentence, this RN has to repeat things. Unsure if this is pt baseline or new from fall. No caregiver at bedside. Pt is from [facility].

## 2015-07-23 NOTE — ED Notes (Signed)
Pt ambulatory with steady gait with grip socks on in hallway with this RN.

## 2015-07-23 NOTE — ED Notes (Signed)
EMS at bedside to transfer pt back to nursing home

## 2015-07-23 NOTE — Discharge Instructions (Signed)
Your workup in the Emergency Department today was reassuring.  We did not find any specific abnormalities.  We recommend you drink plenty of fluids, take your regular medications, and follow up with the doctor(s) listed in these documents as recommended.  Return to the Emergency Department if you develop new or worsening symptoms that concern you.   Dementia Dementia is a general term for problems with brain function. A person with dementia has memory loss and a hard time with at least one other brain function such as thinking, speaking, or problem solving. Dementia can affect social functioning, how you do your job, your mood, or your personality. The changes may be hidden for a long time. The earliest forms of this disease are usually not detected by family or friends. Dementia can be:  Irreversible.  Potentially reversible.  Partially reversible.  Progressive. This means it can get worse over time. CAUSES  Irreversible dementia causes may include:  Degeneration of brain cells (Alzheimer disease or Lewy body dementia).  Multiple small strokes (vascular dementia).  Infection (chronic meningitis or Creutzfeldt-Jakob disease).  Frontotemporal dementia. This affects younger people, age 60 to 78, compared to those who have Alzheimer disease.  Dementia associated with other disorders like Parkinson disease, Huntington disease, or HIV-associated dementia. Potentially or partially reversible dementia causes may include:  Medicines.  Metabolic causes such as excessive alcohol intake, vitamin B12 deficiency, or thyroid disease.  Masses or pressure in the brain such as a tumor, blood clot, or hydrocephalus. SIGNS AND SYMPTOMS  Symptoms are often hard to detect. Family members or coworkers may not notice them early in the disease process. Different people with dementia may have different symptoms. Symptoms can include:  A hard time with memory, especially recent memory. Long-term memory  may not be impaired.  Asking the same question multiple times or forgetting something someone just said.  A hard time speaking your thoughts or finding certain words.  A hard time solving problems or performing familiar tasks (such as how to use a telephone).  Sudden changes in mood.  Changes in personality, especially increasing moodiness or mistrust.  Depression.  A hard time understanding complex ideas that were never a problem in the past. DIAGNOSIS  There are no specific tests for dementia.   Your health care provider may recommend a thorough evaluation. This is because some forms of dementia can be reversible. The evaluation will likely include a physical exam and getting a detailed history from you and a family member. The history often gives the best clues and suggestions for a diagnosis.  Memory testing may be done. A detailed brain function evaluation called neuropsychologic testing may be helpful.  Lab tests and brain imaging (such as a CT scan or MRI scan) are sometimes important.  Sometimes observation and re-evaluation over time is very helpful. TREATMENT  Treatment depends on the cause.   If the problem is a vitamin deficiency, it may be helped or cured with supplements.  For dementias such as Alzheimer disease, medicines are available to stabilize or slow the course of the disease. There are no cures for this type of dementia.  Your health care provider can help direct you to groups, organizations, and other health care providers to help with decisions in the care of you or your loved one. HOME CARE INSTRUCTIONS The care of individuals with dementia is varied and dependent upon the progression of the dementia. The following suggestions are intended for the person living with, or caring for, the person with  dementia.  Create a safe environment.  Remove the locks on bathroom doors to prevent the person from accidentally locking himself or herself in.  Use  childproof latches on kitchen cabinets and any place where cleaning supplies, chemicals, or alcohol are kept.  Use childproof covers in unused electrical outlets.  Install childproof devices to keep doors and windows secured.  Remove stove knobs or install safety knobs and an automatic shut-off on the stove.  Lower the temperature on water heaters.  Label medicines and keep them locked up.  Secure knives, lighters, matches, power tools, and guns, and keep these items out of reach.  Keep the house free from clutter. Remove rugs or anything that might contribute to a fall.  Remove objects that might break and hurt the person.  Make sure lighting is good, both inside and outside.  Install grab rails as needed.  Use a monitoring device to alert you to falls or other needs for help.  Reduce confusion.  Keep familiar objects and people around.  Use night lights or dim lights at night.  Label items or areas.  Use reminders, notes, or directions for daily activities or tasks.  Keep a simple, consistent routine for waking, meals, bathing, dressing, and bedtime.  Create a calm, quiet environment.  Place large clocks and calendars prominently.  Display emergency numbers and home address near all telephones.  Use cues to establish different times of the day. An example is to open curtains to let the natural light in during the day.   Use effective communication.  Choose simple words and short sentences.  Use a gentle, calm tone of voice.  Be careful not to interrupt.  If the person is struggling to find a word or communicate a thought, try to provide the word or thought.  Ask one question at a time. Allow the person ample time to answer questions. Repeat the question again if the person does not respond.  Reduce nighttime restlessness.  Provide a comfortable bed.  Have a consistent nighttime routine.  Ensure a regular walking or physical activity schedule. Involve  the person in daily activities as much as possible.  Limit napping during the day.  Limit caffeine.  Attend social events that stimulate rather than overwhelm the senses.  Encourage good nutrition and hydration.  Reduce distractions during meal times and snacks.  Avoid foods that are too hot or too cold.  Monitor chewing and swallowing ability.  Continue with routine vision, hearing, dental, and medical screenings.  Give medicines only as directed by the health care provider.  Monitor driving abilities. Do not allow the person to drive when safe driving is no longer possible.  Register with an identification program which could provide location assistance in the event of a missing person situation. SEEK MEDICAL CARE IF:   New behavioral problems start such as moodiness, aggressiveness, or seeing things that are not there (hallucinations).  Any new problem with brain function happens. This includes problems with balance, speech, or falling a lot.  Problems with swallowing develop.  Any symptoms of other illness happen. Small changes or worsening in any aspect of brain function can be a sign that the illness is getting worse. It can also be a sign of another medical illness such as infection. Seeing a health care provider right away is important. SEEK IMMEDIATE MEDICAL CARE IF:   A fever develops.  New or worsened confusion develops.  New or worsened sleepiness develops.  Staying awake becomes hard to do.  This information is not intended to replace advice given to you by your health care provider. Make sure you discuss any questions you have with your health care provider.   Document Released: 12/14/2000 Document Revised: 07/11/2014 Document Reviewed: 11/15/2010 Elsevier Interactive Patient Education [employer].

## 2015-08-06 ENCOUNTER — Emergency Department
Admission: EM | Admit: 2015-08-06 | Discharge: 2015-08-06 | Disposition: A | Discharge status: Home / Self Care | Payer: Medicare Other | Attending: Emergency Medicine | Admitting: Emergency Medicine

## 2015-08-06 ENCOUNTER — Emergency Department: Payer: Medicare Other

## 2015-08-06 ENCOUNTER — Encounter: Payer: Self-pay | Admitting: Emergency Medicine

## 2015-08-06 DIAGNOSIS — G309 Alzheimer's disease, unspecified: Secondary | ICD-10-CM | POA: Diagnosis not present

## 2015-08-06 DIAGNOSIS — Y998 Other external cause status: Secondary | ICD-10-CM | POA: Diagnosis not present

## 2015-08-06 DIAGNOSIS — Y9389 Activity, other specified: Secondary | ICD-10-CM | POA: Diagnosis not present

## 2015-08-06 DIAGNOSIS — N390 Urinary tract infection, site not specified: Secondary | ICD-10-CM | POA: Insufficient documentation

## 2015-08-06 DIAGNOSIS — Y92128 Other place in nursing home as the place of occurrence of the external cause: Secondary | ICD-10-CM | POA: Insufficient documentation

## 2015-08-06 DIAGNOSIS — F0280 Dementia in other diseases classified elsewhere without behavioral disturbance: Secondary | ICD-10-CM | POA: Diagnosis not present

## 2015-08-06 DIAGNOSIS — Z79899 Other long term (current) drug therapy: Secondary | ICD-10-CM | POA: Insufficient documentation

## 2015-08-06 DIAGNOSIS — Z043 Encounter for examination and observation following other accident: Secondary | ICD-10-CM | POA: Diagnosis not present

## 2015-08-06 DIAGNOSIS — W07XXXA Fall from chair, initial encounter: Secondary | ICD-10-CM | POA: Insufficient documentation

## 2015-08-06 LAB — TROPONIN I

## 2015-08-06 LAB — URINALYSIS COMPLETE WITH MICROSCOPIC (ARMC ONLY)
Bilirubin Urine: NEGATIVE
GLUCOSE, UA: NEGATIVE mg/dL
HGB URINE DIPSTICK: NEGATIVE
Ketones, ur: NEGATIVE mg/dL
NITRITE: NEGATIVE
PROTEIN: NEGATIVE mg/dL
SPECIFIC GRAVITY, URINE: 1.008 (ref 1.005–1.030)
pH: 6 (ref 5.0–8.0)

## 2015-08-06 LAB — BASIC METABOLIC PANEL
Anion gap: 4 — ABNORMAL LOW (ref 5–15)
BUN: 21 mg/dL — AB (ref 6–20)
CHLORIDE: 107 mmol/L (ref 101–111)
CO2: 29 mmol/L (ref 22–32)
CREATININE: 0.9 mg/dL (ref 0.44–1.00)
Calcium: 8.7 mg/dL — ABNORMAL LOW (ref 8.9–10.3)
GFR, EST NON AFRICAN AMERICAN: 56 mL/min — AB (ref >60)
Glucose, Bld: 97 mg/dL (ref 65–99)
Potassium: 4.2 mmol/L (ref 3.5–5.1)
SODIUM: 140 mmol/L (ref 135–145)

## 2015-08-06 LAB — CBC WITH DIFFERENTIAL/PLATELET
BASOS ABS: 0 10*3/uL (ref 0–0.1)
BASOS PCT: 1 %
EOS ABS: 0.3 10*3/uL (ref 0–0.7)
EOS PCT: 5 %
HCT: 32.2 % — ABNORMAL LOW (ref 35.0–47.0)
Hemoglobin: 10.8 g/dL — ABNORMAL LOW (ref 12.0–16.0)
Lymphocytes Relative: 25 %
Lymphs Abs: 1.7 10*3/uL (ref 1.0–3.6)
MCH: 29.7 pg (ref 26.0–34.0)
MCHC: 33.6 g/dL (ref 32.0–36.0)
MCV: 88.4 fL (ref 80.0–100.0)
MONO ABS: 0.7 10*3/uL (ref 0.2–0.9)
Monocytes Relative: 11 %
Neutro Abs: 3.9 10*3/uL (ref 1.4–6.5)
Neutrophils Relative %: 58 %
PLATELETS: 202 10*3/uL (ref 150–440)
RBC: 3.64 MIL/uL — AB (ref 3.80–5.20)
RDW: 14 % (ref 11.5–14.5)
WBC: 6.7 10*3/uL (ref 3.6–11.0)

## 2015-08-06 MED ORDER — CEPHALEXIN 500 MG PO CAPS: 500.0000 mg | ORAL_CAPSULE | Freq: Two times a day (BID) | ORAL | Status: DC

## 2015-08-06 NOTE — ED Notes (Signed)
PT to ER via EMS from [facility].  Reports that husband states pt slid out of chair.  Pt reports neck pain.  Intermittent reports of arm pain.  Pt unable to denote left or right arm pain.

## 2015-08-06 NOTE — Discharge Instructions (Signed)

## 2015-08-06 NOTE — ED Provider Notes (Signed)
[facility] Emergency Department Provider Note   ____________________________________________  Time seen:  I have reviewed the triage vital signs and the triage nursing note.  HISTORY  Chief Complaint Fall   Historian Limited/poor historian, dementia. From [place]. History per report from EMS  HPI Catherine Frazier is a 82 y.o. female who has dementia and lives at assisted living facility with her husband was reported to have slid out of her chair. I'm unable to confirm this. The patient herself does not appear to have any traumatic injuries including her all extremities and no head injury or pain on exam. However she does have dementia and is an unreliable historian. Patient denies trouble breathing or chest pain. She denies extremity pain.    Past Medical History  Diagnosis Date  . Dementia   . Anxiety   . Cancer ([place])   . Breast cancer ([place])   . Alzheimer's dementia   . Depression     There are no active problems to display for this patient.   History reviewed. No pertinent past surgical history.  Current Outpatient Rx  Name  Route  Sig  Dispense  Refill  . clobetasol cream (TEMOVATE) 0.05 %   Topical   Apply 1 application topically 2 (two) times daily as needed (dry, red skin).          . LORazepam (ATIVAN) 0.5 MG tablet   Oral   Take 0.5 mg by mouth every 6 (six) hours as needed for anxiety.          . mineral oil-hydrophilic petrolatum (AQUAPHOR) ointment   Topical   Apply 1 application topically 2 (two) times daily. Apply to legs         . mirtazapine (REMERON) 15 MG tablet   Oral   Take 15 mg by mouth at bedtime.         . sertraline (ZOLOFT) 100 MG tablet   Oral   Take 100 mg by mouth at bedtime.         . cephALEXin (KEFLEX) 500 MG capsule   Oral   Take 1 capsule (500 mg total) by mouth 2 (two) times daily.   14 capsule   0     Allergies Review of patient's allergies indicates no known  allergies.  History reviewed. No pertinent family history.  Social History Social History  Substance Use Topics  . Smoking status: Never Smoker   . Smokeless tobacco: Never Used  . Alcohol Use: No    Review of Systems Limited due to dementia Constitutional:  Eyes:  ENT: Negative for sore throat. Cardiovascular: Negative for chest pain. Respiratory: Negative for shortness of breath. Gastrointestinal: Negative for abdominal pain Genitourinary: . Musculoskeletal: Negative for back pain. Skin:  Neurological: Negative for headache. 10 point Review of Systems otherwise negative ____________________________________________   PHYSICAL EXAM:  VITAL SIGNS: ED Triage Vitals  Enc Vitals Group     BP 08/06/15 1344 139/70 mmHg     Pulse Rate 08/06/15 1344 63     Resp 08/06/15 1344 18     Temp 08/06/15 1344 97.7 F (36.5 C)     Temp src --      SpO2 08/06/15 1344 98 %     Weight 08/06/15 1344 160 lb (72.576 kg)     Height 08/06/15 1344 5\' 6"  (1.676 m)     Head Cir --      Peak Flow --      Pain Score 08/06/15 1346 6  Pain Loc --      Pain Edu? --      Excl. in [place]? --      Constitutional: Alert to voice but then falls back asleep. Cooperative, but poor historian. Well appearing and in no distress. Eyes: Conjunctivae are normal. PERRL. Normal extraocular movements. ENT   Head: Normocephalic and atraumatic.   Nose: No congestion/rhinnorhea.   Mouth/Throat: Mucous membranes are moist.   Neck: No stridor. Cardiovascular/Chest: Normal rate, regular rhythm.  No murmurs, rubs, or gallops. Respiratory: Normal respiratory effort without tachypnea nor retractions. Breath sounds are clear and equal bilaterally. No wheezes/rales/rhonchi. Gastrointestinal: Soft. No distention, no guarding, no rebound. Nontender.    Genitourinary/rectal:Deferred Musculoskeletal: Pelvis stable and nontender. Full range of motion of 4 extremities without pain. No evidence of trauma.  No  joint effusions.  No lower extremity tenderness.  No edema. Neurologic:  No slurred speech or aphasia. No gross or focal neurologic deficits are appreciated. Skin:  Skin is warm, dry and intact. No rash noted.   ____________________________________________   EKG I, Lisa Roca, MD, the attending physician have personally viewed and interpreted all ECGs.  62 bpm. Normal sinus rhythm. Narrow QRS. Normal axis. Normal ST and T-wave  ____________________________________________  LABS (pertinent positives/negatives)  Urinalysis 1+ leukocytes, 6-30 white blood cells and few bacteria Basic metabolic panel without significant abnormalities Troponin less than 0.03 White blood count 6.7, hemoglobin 10.8 and platelet count 202   ____________________________________________  RADIOLOGY All Xrays were viewed by me. Imaging interpreted by Radiologist.  CT head and C-spine noncontrast:  IMPRESSION: No acute abnormality head or cervical spine.  Atrophy and chronic microvascular ischemic change.  Cervical spondylosis and scoliosis. __________________________________________  PROCEDURES  Procedure(s) performed: None  Critical Care performed: None  ____________________________________________   ED COURSE / ASSESSMENT AND PLAN  Pertinent labs & imaging results that were available during my care of the patient were reviewed by me and considered in my medical decision making (see chart for details).   Patient with unknown mental status at baseline, and I'm not exactly clear in all traumatic fall actually was. CT head and C-spine were obtained and negative for acute traumatic injury.  Labs were unremarkable. Urinalysis positive for urinary tract infection.  Son did come to the bedside and states the patient's mental status is essentially at her baseline.  Was given IV Rocephin here in the emergency department and will be discharged with Keflex. Urine culture  sent.     CONSULTATIONS:   None   Patient / Family / Caregiver informed of clinical course, medical decision-making process, and agree with plan.   I discussed return precautions, follow-up instructions, and discharged instructions with patient and/or family.   ___________________________________________   FINAL CLINICAL IMPRESSION(S) / ED DIAGNOSES   Final diagnoses:  UTI (lower urinary tract infection)              Note: This dictation was prepared with Dragon dictation. Any transcriptional errors that result from this process are unintentional   Lisa Roca, MD 08/06/15 [phone]

## 2015-09-21 ENCOUNTER — Emergency Department
Admission: EM | Admit: 2015-09-21 | Discharge: 2015-09-21 | Disposition: A | Discharge status: Home / Self Care | Payer: Medicare Other | Attending: Emergency Medicine | Admitting: Emergency Medicine

## 2015-09-21 ENCOUNTER — Encounter: Payer: Self-pay | Admitting: Emergency Medicine

## 2015-09-21 ENCOUNTER — Emergency Department: Payer: Medicare Other

## 2015-09-21 DIAGNOSIS — Z792 Long term (current) use of antibiotics: Secondary | ICD-10-CM | POA: Diagnosis not present

## 2015-09-21 DIAGNOSIS — S50311A Abrasion of right elbow, initial encounter: Secondary | ICD-10-CM | POA: Insufficient documentation

## 2015-09-21 DIAGNOSIS — Y998 Other external cause status: Secondary | ICD-10-CM | POA: Insufficient documentation

## 2015-09-21 DIAGNOSIS — W19XXXA Unspecified fall, initial encounter: Secondary | ICD-10-CM

## 2015-09-21 DIAGNOSIS — G309 Alzheimer's disease, unspecified: Secondary | ICD-10-CM | POA: Insufficient documentation

## 2015-09-21 DIAGNOSIS — Z79899 Other long term (current) drug therapy: Secondary | ICD-10-CM | POA: Diagnosis not present

## 2015-09-21 DIAGNOSIS — S79912A Unspecified injury of left hip, initial encounter: Secondary | ICD-10-CM | POA: Diagnosis present

## 2015-09-21 DIAGNOSIS — Z23 Encounter for immunization: Secondary | ICD-10-CM | POA: Diagnosis not present

## 2015-09-21 DIAGNOSIS — Y9289 Other specified places as the place of occurrence of the external cause: Secondary | ICD-10-CM | POA: Diagnosis not present

## 2015-09-21 DIAGNOSIS — W1839XA Other fall on same level, initial encounter: Secondary | ICD-10-CM | POA: Diagnosis not present

## 2015-09-21 DIAGNOSIS — F0280 Dementia in other diseases classified elsewhere without behavioral disturbance: Secondary | ICD-10-CM | POA: Diagnosis not present

## 2015-09-21 DIAGNOSIS — Y9389 Activity, other specified: Secondary | ICD-10-CM | POA: Diagnosis not present

## 2015-09-21 DIAGNOSIS — S79922A Unspecified injury of left thigh, initial encounter: Secondary | ICD-10-CM | POA: Diagnosis not present

## 2015-09-21 DIAGNOSIS — M25552 Pain in left hip: Secondary | ICD-10-CM

## 2015-09-21 MED ORDER — TETANUS-DIPHTH-ACELL PERTUSSIS 5-2.5-18.5 LF-MCG/0.5 IM SUSP
0.5000 mL | Freq: Once | INTRAMUSCULAR | Status: AC
  Administered 2015-09-21: 0.5 mL via INTRAMUSCULAR
  Filled 2015-09-21: qty 0.5

## 2015-09-21 NOTE — ED Notes (Signed)
Difficult to assess pt, only oriented to person. From [facility].

## 2015-09-21 NOTE — ED Notes (Signed)
Pt in xray

## 2015-09-21 NOTE — Discharge Instructions (Signed)
Please have Miss Valcin continue to use her walker for ambulation. You may apply Neosporin or any triple antibiotic cream to her right elbow abrasion and a thick coat 3 times daily until it has completely resolved. She may have Tylenol for pain.  Return to the emergency department for changes in mental status, inability to walk, severe pain, fainting, or any other symptoms concerning to you.  Abrasion An abrasion is a cut or scrape on the outer surface of your skin. An abrasion does not extend through all of the layers of your skin. It is important to care for your abrasion properly to prevent infection. CAUSES Most abrasions are caused by falling on or gliding across the ground or another surface. When your skin rubs on something, the outer and inner layer of skin rubs off.  SYMPTOMS A cut or scrape is the main symptom of this condition. The scrape may be bleeding, or it may appear red or pink. If there was an associated fall, there may be an underlying bruise. DIAGNOSIS An abrasion is diagnosed with a physical exam. TREATMENT Treatment for this condition depends on how large and deep the abrasion is. Usually, your abrasion will be cleaned with water and mild soap. This removes any dirt or debris that may be stuck. An antibiotic ointment may be applied to the abrasion to help prevent infection. A bandage (dressing) may be placed on the abrasion to keep it clean. You may also need a tetanus shot. HOME CARE INSTRUCTIONS Medicines  Take or apply medicines only as directed by your health care provider.  If you were prescribed an antibiotic ointment, finish all of it even if you start to feel better. Wound Care  Clean the wound with mild soap and water 2-3 times per day or as directed by your health care provider. Pat your wound dry with a clean towel. Do not rub it.  There are many different ways to close and cover a wound. Follow instructions from your health care provider about:  Wound  care.  Dressing changes and removal.  Check your wound every day for signs of infection. Watch for:  Redness, swelling, or pain.  Fluid, blood, or pus. General Instructions  Keep the dressing dry as directed by your health care provider. Do not take baths, swim, use a hot tub, or do anything that would put your wound underwater until your health care provider approves.  If there is swelling, raise (elevate) the injured area above the level of your heart while you are sitting or lying down.  Keep all follow-up visits as directed by your health care provider. This is important. SEEK MEDICAL CARE IF:  You received a tetanus shot and you have swelling, severe pain, redness, or bleeding at the injection site.  Your pain is not controlled with medicine.  You have increased redness, swelling, or pain at the site of your wound. SEEK IMMEDIATE MEDICAL CARE IF:  You have a red streak going away from your wound.  You have a fever.  You have fluid, blood, or pus coming from your wound.  You notice a bad smell coming from your wound or your dressing.   This information is not intended to replace advice given to you by your health care provider. Make sure you discuss any questions you have with your health care provider.   Document Released: 03/30/2005 Document Revised: 03/11/2015 Document Reviewed: 06/18/2014 Elsevier Interactive Patient Education [employer].

## 2015-09-21 NOTE — ED Notes (Signed)
Pt had witness mechanical fall.  Went to stand and wasn't using walker.  Pt fell, no LOC.  Has dementia.  Fall was witnessed.  Old abrasion to right elbow.

## 2015-09-21 NOTE — ED Notes (Signed)
[facility] does not provide transportation.  Son can pick up at [facility] today.  Will hold pt in bed until he can get her so she does not have to go via ambulance.

## 2015-09-21 NOTE — ED Provider Notes (Signed)
[facility] Emergency Department Provider Note  ____________________________________________  Time seen: Approximately 12:27 PM  I have reviewed the triage vital signs and the nursing notes.   HISTORY  Chief Complaint Fall  History and physical examination are limited due to severe baseline dementia.  HPI Catherine Frazier is a 82 y.o. female with a history of severe dementia presenting for witnessed mechanical fall.I am unable to obtain any history from the patient as she does not know where she is or why she is here. She is denying any acute pain.   Past Medical History  Diagnosis Date  . Dementia   . Anxiety   . Cancer ([place])   . Breast cancer ([place])   . Alzheimer's dementia   . Depression     There are no active problems to display for this patient.   History reviewed. No pertinent past surgical history.  Current Outpatient Rx  Name  Route  Sig  Dispense  Refill  . cephALEXin (KEFLEX) 500 MG capsule   Oral   Take 1 capsule (500 mg total) by mouth 2 (two) times daily.   14 capsule   0   . clobetasol cream (TEMOVATE) 0.05 %   Topical   Apply 1 application topically 2 (two) times daily as needed (dry, red skin).          . LORazepam (ATIVAN) 0.5 MG tablet   Oral   Take 0.5 mg by mouth every 6 (six) hours as needed for anxiety.          . mineral oil-hydrophilic petrolatum (AQUAPHOR) ointment   Topical   Apply 1 application topically 2 (two) times daily. Apply to legs         . mirtazapine (REMERON) 15 MG tablet   Oral   Take 15 mg by mouth at bedtime.         . sertraline (ZOLOFT) 100 MG tablet   Oral   Take 100 mg by mouth at bedtime.           Allergies Review of patient's allergies indicates no known allergies.  History reviewed. No pertinent family history.  Social History Social History  Substance Use Topics  . Smoking status: Never Smoker   . Smokeless tobacco: Never Used  . Alcohol Use: No    Review of  Systems Unable to obtain. She denies any acute pain.  ____________________________________________   PHYSICAL EXAM:  VITAL SIGNS: ED Triage Vitals  Enc Vitals Group     BP 09/21/15 1206 171/73 mmHg     Pulse Rate 09/21/15 1206 66     Resp 09/21/15 1206 17     Temp 09/21/15 1206 98.1 F (36.7 C)     Temp Source 09/21/15 1206 Oral     SpO2 09/21/15 1202 99 %     Weight 09/21/15 1206 160 lb (72.576 kg)     Height --      Head Cir --      Peak Flow --      Pain Score --      Pain Loc --      Pain Edu? --      Excl. in [place]? --     Constitutional: Patient is alert but not oriented to anything other than herself. She does not know where she is or why she is here. She is comfortable appearing and nontoxic on my exam.  Eyes: Conjunctivae are normal.  EOMI. no raccoon eyes. No scleral icterus. Head: Atraumatic. No Battle sign.  No evidence of swelling or ecchymosis on the face or the scalp. Nose: No congestion/rhinnorhea. Nose is midline without any swelling. No septal hematoma. Mouth/Throat: Mucous membranes are moist. No dental injury. No trismus. Neck: No stridor.  Supple.  No midline C-spine tenderness or step-offs on my exam. I'll range of motion without pain. Cardiovascular: Normal rate, regular rhythm. No murmurs, rubs or gallops.  Respiratory: Normal respiratory effort.  No retractions. Lungs CTAB.  No wheezes, rales or ronchi. Gastrointestinal: Soft and nontender. No distention. No peritoneal signs. Musculoskeletal: 0.5 x 0.5 hemostatic abrasion over the right elbow. Full range of motion of the bilateral shoulders, elbows, wrists without pain. Normal radial pulses bilaterally. Mild tenderness to palpation over the left upper lateral thigh and left greater trochanter without leg shorteninf;  patient does have full range of motion without pain. Positive full range of motion without pain of the right hip, bilateral knees, bilateral ankles. Neurologic:  Patient has occasional  nonsensical speech and is alert to person only. Face is symmetric, smile is symmetric. Extraocular movements are intact. Normal grip strength bilaterally. Able to move both legs. Denies any decreased sensation in the face, upper extremities and lower extremities. Skin:  Skin is warm, dry. Abrasion on right elbow as described above. Positive old ecchymosis on the right anterior tibia. Skin is intact over the tibia. Psychiatric: Mood and affect are normal.  ____________________________________________   LABS (all labs ordered are listed, but only abnormal results are displayed)  Labs Reviewed - No data to display ____________________________________________  EKG  ED ECG REPORT I, Eula Listen, the attending physician, personally viewed and interpreted this ECG.   Date: 09/21/2015  EKG Time: 1355  Rate: 65  Rhythm: normal sinus rhythm  Axis: Normal  Intervals:none  ST&T Change: Nonspecific T-wave inversion in V1 and V2. No ST elevation. No ischemic changes.  ____________________________________________  RADIOLOGY  Dg Elbow Complete Right  09/21/2015  CLINICAL DATA:  Fall today with elbow pain, initial encounter EXAM: RIGHT ELBOW - COMPLETE 3+ VIEW COMPARISON:  None. FINDINGS: There is no evidence of fracture, dislocation, or joint effusion. There is no evidence of arthropathy or other focal bone abnormality. Soft tissues are unremarkable. IMPRESSION: No acute abnormality noted. Electronically Signed   By: Inez Catalina M.D.   On: 09/21/2015 13:30   Dg Hip Unilat With Pelvis 2-3 Views Left  09/21/2015  CLINICAL DATA:  Fall today, left hip pain EXAM: DG HIP (WITH OR WITHOUT PELVIS) 2-3V LEFT COMPARISON:  05/04/2015 FINDINGS: Three views of the left hip submitted. No acute fracture or subluxation. Diffuse osteopenia. There is mild narrowing of superior hip joint space. Mild superior acetabular spurring bilaterally. Degenerative changes pubic symphysis. Mild spurring of femoral head.  IMPRESSION: No acute fracture or subluxation. Degenerative changes as described above. Diffuse osteopenia. Electronically Signed   By: Lahoma Crocker M.D.   On: 09/21/2015 13:27    ____________________________________________   PROCEDURES  Procedure(s) performed: None  Critical Care performed: No ____________________________________________   INITIAL IMPRESSION / ASSESSMENT AND PLAN / ED COURSE  Pertinent labs & imaging results that were available during my care of the patient were reviewed by me and considered in my medical decision making (see chart for details).  82 y.o. female with advanced also has dementia presenting with a witnessed fall, resulting minor abrasion to the right elbow and tenderness to palpation of the left greater trochanter without any shortening of the leg. There is no evidence that the patient had a syncopal episode and she has  no acute neurologic findings on exam. I will do atraumatic evaluation today, and update her tetanus. If her evaluation here is negative, I anticipate discharge back to her nursing home.  1:41 PM  Phone call with pt's nurse at [facility]: Per the pt's nurse, the pt had a witnessed fall on the way to the dining room.  At baseline, the pt uses a walker, and did have it today.  She fell landing on her back but did not obviously strike her head.  Her mental status was unchanged, and she did not complain of pain but the staff was instructed not to move her so she did not stand up after her fall.  Last fall 11/16. The pt's baseline is that she is alert only to herself, and even at times does not know this. Pleasantly demented patient.  ----------------------------------------- 1:40 PM on 09/21/2015 -----------------------------------------  The patient has been resting comfortably since her arrival. She did receive a T Booster. She has no evidence of fractures in her right elbow or in her left hip. I do not see any other signs of trauma from her  fall. Plan to discharge her back to her nursing facility with close PMD follow-up. Specific return instructions were provided.  ____________________________________________  FINAL CLINICAL IMPRESSION(S) / ED DIAGNOSES  Final diagnoses:  Fall, initial encounter  Elbow abrasion, right, initial encounter  Left hip pain      NEW MEDICATIONS STARTED DURING THIS VISIT:  New Prescriptions   No medications on file  \   Eula Listen, MD 09/21/15 1407

## 2015-09-21 NOTE — ED Notes (Signed)
Called report to sydney boone at [facility].

## 2015-09-24 ENCOUNTER — Emergency Department: Payer: Medicare Other

## 2015-09-24 ENCOUNTER — Encounter: Payer: Self-pay | Admitting: Emergency Medicine

## 2015-09-24 ENCOUNTER — Emergency Department
Admission: EM | Admit: 2015-09-24 | Discharge: 2015-09-24 | Disposition: A | Discharge status: Home / Self Care | Payer: Medicare Other | Attending: Emergency Medicine | Admitting: Emergency Medicine

## 2015-09-24 DIAGNOSIS — F0280 Dementia in other diseases classified elsewhere without behavioral disturbance: Secondary | ICD-10-CM | POA: Insufficient documentation

## 2015-09-24 DIAGNOSIS — Y998 Other external cause status: Secondary | ICD-10-CM | POA: Insufficient documentation

## 2015-09-24 DIAGNOSIS — S0990XA Unspecified injury of head, initial encounter: Secondary | ICD-10-CM | POA: Insufficient documentation

## 2015-09-24 DIAGNOSIS — W010XXA Fall on same level from slipping, tripping and stumbling without subsequent striking against object, initial encounter: Secondary | ICD-10-CM | POA: Insufficient documentation

## 2015-09-24 DIAGNOSIS — Z792 Long term (current) use of antibiotics: Secondary | ICD-10-CM | POA: Diagnosis not present

## 2015-09-24 DIAGNOSIS — Z79899 Other long term (current) drug therapy: Secondary | ICD-10-CM | POA: Diagnosis not present

## 2015-09-24 DIAGNOSIS — G309 Alzheimer's disease, unspecified: Secondary | ICD-10-CM | POA: Diagnosis not present

## 2015-09-24 DIAGNOSIS — Y9301 Activity, walking, marching and hiking: Secondary | ICD-10-CM | POA: Insufficient documentation

## 2015-09-24 DIAGNOSIS — Y92128 Other place in nursing home as the place of occurrence of the external cause: Secondary | ICD-10-CM | POA: Insufficient documentation

## 2015-09-24 DIAGNOSIS — W19XXXA Unspecified fall, initial encounter: Secondary | ICD-10-CM

## 2015-09-24 NOTE — ED Provider Notes (Signed)
[facility] Emergency Department Provider Note  ___________________________________________  Time seen: Approximately 1:15 PM  I have reviewed the triage vital signs and the nursing notes.   HISTORY  Chief Complaint Fall  HPI Catherine Frazier is a 82 y.o. female who has history of dementia who had a witnessed fall while walking to the dining room at the nursing care facility. Patient was noted to have tripped and fell, and there is question of head injury. On arrival to the ER patient did not have any complaints of pain. Patient was sent over for evaluation. Patient is unable to give [place] any other significant history secondary to her dementia.   Past Medical History  Diagnosis Date  . Dementia   . Anxiety   . Cancer ([place])   . Breast cancer ([place])   . Alzheimer's dementia   . Depression     There are no active problems to display for this patient.   History reviewed. No pertinent past surgical history.  Current Outpatient Rx  Name  Route  Sig  Dispense  Refill  . cephALEXin (KEFLEX) 500 MG capsule   Oral   Take 1 capsule (500 mg total) by mouth 2 (two) times daily.   14 capsule   0   . clobetasol cream (TEMOVATE) 0.05 %   Topical   Apply 1 application topically 2 (two) times daily as needed (dry, red skin).          . LORazepam (ATIVAN) 0.5 MG tablet   Oral   Take 0.5 mg by mouth every 6 (six) hours as needed for anxiety.          . mineral oil-hydrophilic petrolatum (AQUAPHOR) ointment   Topical   Apply 1 application topically 2 (two) times daily. Apply to legs         . mirtazapine (REMERON) 15 MG tablet   Oral   Take 15 mg by mouth at bedtime.         . sertraline (ZOLOFT) 100 MG tablet   Oral   Take 100 mg by mouth at bedtime.           Allergies Review of patient's allergies indicates no known allergies.  No family history on file.  Social History Social History  Substance Use Topics  . Smoking status: Never  Smoker   . Smokeless tobacco: Never Used  . Alcohol Use: No    Review of Systems Patient unable to give any review of systems secondary to the severity of her dementia  10-point ROS otherwise negative.  ____________________________________________   PHYSICAL EXAM:  VITAL SIGNS: ED Triage Vitals  Enc Vitals Group     BP 09/24/15 1233 168/90 mmHg     Pulse Rate 09/24/15 1233 82     Resp 09/24/15 1233 16     Temp 09/24/15 1233 98 F (36.7 C)     Temp Source 09/24/15 1233 Oral     SpO2 09/24/15 1233 97 %     Weight 09/24/15 1233 160 lb (72.576 kg)     Height 09/24/15 1233 5\' 7"  (1.702 m)     Head Cir --      Peak Flow --      Pain Score --      Pain Loc --      Pain Edu? --      Excl. in [place]? --     Constitutional: Alert and oriented to name only. Well appearing and in no acute distress. Eyes: Conjunctivae are normal.  PERRL. EOMI. Head: Atraumatic. Nose: No congestion/rhinnorhea. Mouth/Throat: Mucous membranes are moist.  Oropharynx non-erythematous. Neck: No stridor.   Cardiovascular: Normal rate, regular rhythm. Grossly normal heart sounds.  Good peripheral circulation. Respiratory: Normal respiratory effort.  No retractions. Lungs CTAB. Gastrointestinal: Soft and nontender. No distention. No abdominal bruits. No CVA tenderness. Musculoskeletal: No lower extremity tenderness nor edema.  No joint effusions. Neurologic:   garbled language secondary to her dementia patient is oriented 1.. No gross focal neurologic deficits are appreciated. No gait instability. Skin:  Skin is warm, dry and intact. No rash noted. Psychiatric: Mood and affect are normal. Speech and behavior are normal.  ____________________________________________   LABS (all labs ordered are listed, but only abnormal results are displayed)  Labs Reviewed - No data to display ____________________________________________  EKG  None ____________________________________________  RADIOLOGY  Dg  Pelvis 1-2 Views  09/24/2015  CLINICAL DATA:  Recent fall with pelvic pain, initial encounter EXAM: PELVIS - 1-2 VIEW COMPARISON:  None. FINDINGS: Pelvic ring is intact. No acute fracture or dislocation is noted. Degenerative changes in lower lumbar spine are seen. Postsurgical changes in the pelvis are noted. IMPRESSION: No acute abnormality noted. Electronically Signed   By: Inez Catalina M.D.   On: 09/24/2015 15:05   Dg Elbow Complete Right  09/21/2015  CLINICAL DATA:  Fall today with elbow pain, initial encounter EXAM: RIGHT ELBOW - COMPLETE 3+ VIEW COMPARISON:  None. FINDINGS: There is no evidence of fracture, dislocation, or joint effusion. There is no evidence of arthropathy or other focal bone abnormality. Soft tissues are unremarkable. IMPRESSION: No acute abnormality noted. Electronically Signed   By: Inez Catalina M.D.   On: 09/21/2015 13:30   Ct Head Wo Contrast  09/24/2015  CLINICAL DATA:  Status post fall today.  Initial encounter. EXAM: CT HEAD WITHOUT CONTRAST CT CERVICAL SPINE WITHOUT CONTRAST TECHNIQUE: Multidetector CT imaging of the head and cervical spine was performed following the standard protocol without intravenous contrast. Multiplanar CT image reconstructions of the cervical spine were also generated. COMPARISON:  Head and cervical spine CT scans 08/06/2015. FINDINGS: CT HEAD FINDINGS Cortical atrophy and chronic microvascular ischemic change are again seen. 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 There is no evidence of acute intracranial abnormality including hemorrhage, infarct, mass lesion, mass effect, midline shift or abnormal extra-axial fluid collection. No hydrocephalus or pneumocephalus. The calvarium is intact. Imaged paranasal sinuses and mastoid air cells are clear. CT CERVICAL SPINE FINDINGS Convex left scoliosis is again seen. Vertebral body height is maintained. Trace anterolisthesis C2 on C3 is unchanged. Loss of disc space height is most notable at  C4-5, C5-6 and C6-7, unchanged. IMPRESSION: No acute abnormality head or cervical spine. Cortical atrophy and chronic microvascular ischemic change. Cervical spondylosis. Electronically Signed   By: Inge Rise M.D.   On: 09/24/2015 14:53   Ct Cervical Spine Wo Contrast  09/24/2015  CLINICAL DATA:  Status post fall today.  Initial encounter. EXAM: CT HEAD WITHOUT CONTRAST CT CERVICAL SPINE WITHOUT CONTRAST TECHNIQUE: Multidetector CT imaging of the head and cervical spine was performed following the standard protocol without intravenous contrast. Multiplanar CT image reconstructions of the cervical spine were also generated. COMPARISON:  Head and cervical spine CT scans 08/06/2015. FINDINGS: CT HEAD FINDINGS Cortical atrophy and chronic microvascular ischemic change are again seen. 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 There is no evidence of acute intracranial abnormality including hemorrhage, infarct, mass lesion, mass effect, midline shift or abnormal extra-axial fluid  collection. No hydrocephalus or pneumocephalus. The calvarium is intact. Imaged paranasal sinuses and mastoid air cells are clear. CT CERVICAL SPINE FINDINGS Convex left scoliosis is again seen. Vertebral body height is maintained. Trace anterolisthesis C2 on C3 is unchanged. Loss of disc space height is most notable at C4-5, C5-6 and C6-7, unchanged. IMPRESSION: No acute abnormality head or cervical spine. Cortical atrophy and chronic microvascular ischemic change. Cervical spondylosis. Electronically Signed   By: Inge Rise M.D.   On: 09/24/2015 14:53   Dg Hip Unilat With Pelvis 2-3 Views Left  09/21/2015  CLINICAL DATA:  Fall today, left hip pain EXAM: DG HIP (WITH OR WITHOUT PELVIS) 2-3V LEFT COMPARISON:  05/04/2015 FINDINGS: Three views of the left hip submitted. No acute fracture or subluxation. Diffuse osteopenia. There is mild narrowing of superior hip joint space. Mild superior acetabular spurring  bilaterally. Degenerative changes pubic symphysis. Mild spurring of femoral head. IMPRESSION: No acute fracture or subluxation. Degenerative changes as described above. Diffuse osteopenia. Electronically Signed   By: Lahoma Crocker M.D.   On: 09/21/2015 13:27    ____________________________________________   PROCEDURES  Procedure(s) performed: None  Critical Care performed: No  ____________________________________________   INITIAL IMPRESSION / ASSESSMENT AND PLAN / ED COURSE  Pertinent labs & imaging results that were available during my care of the patient were reviewed by me and considered in my medical decision making (see chart for details).  3:51 PM Patient get CT head and cervical spine as well as a pelvis film.  Patient's x-rays were negative. Patient will be sent back to the nursing care facility for follow-up with her primary care M.D. as an outpatient. ____________________________________________   FINAL CLINICAL IMPRESSION(S) / ED DIAGNOSES  Final diagnoses:  Fall, initial encounter  Closed head injury, initial encounter       Ruby Cola, MD 09/24/15 1551

## 2015-09-24 NOTE — ED Notes (Signed)
Patient sent to ED from [facility] for evaluation due to patient falling in dining room. Staff witnessed fall and state that patient tripped and fell, no LOC.  Patient has history of dementia and frequent falls.

## 2015-09-24 NOTE — ED Notes (Signed)
Patient discharged to [facility] with son.  Report called to [place].  D/C home

## 2015-11-26 ENCOUNTER — Emergency Department: Payer: Medicare Other

## 2015-11-26 ENCOUNTER — Emergency Department
Admission: EM | Admit: 2015-11-26 | Discharge: 2015-11-26 | Disposition: A | Discharge status: Home / Self Care | Payer: Medicare Other | Attending: Emergency Medicine | Admitting: Emergency Medicine

## 2015-11-26 ENCOUNTER — Encounter: Payer: Self-pay | Admitting: Emergency Medicine

## 2015-11-26 DIAGNOSIS — Y939 Activity, unspecified: Secondary | ICD-10-CM | POA: Insufficient documentation

## 2015-11-26 DIAGNOSIS — F329 Major depressive disorder, single episode, unspecified: Secondary | ICD-10-CM | POA: Diagnosis not present

## 2015-11-26 DIAGNOSIS — Z853 Personal history of malignant neoplasm of breast: Secondary | ICD-10-CM | POA: Diagnosis not present

## 2015-11-26 DIAGNOSIS — Y929 Unspecified place or not applicable: Secondary | ICD-10-CM | POA: Diagnosis not present

## 2015-11-26 DIAGNOSIS — G309 Alzheimer's disease, unspecified: Secondary | ICD-10-CM | POA: Insufficient documentation

## 2015-11-26 DIAGNOSIS — S300XXA Contusion of lower back and pelvis, initial encounter: Secondary | ICD-10-CM | POA: Insufficient documentation

## 2015-11-26 DIAGNOSIS — Y999 Unspecified external cause status: Secondary | ICD-10-CM | POA: Insufficient documentation

## 2015-11-26 DIAGNOSIS — W19XXXA Unspecified fall, initial encounter: Secondary | ICD-10-CM | POA: Diagnosis not present

## 2015-11-26 DIAGNOSIS — N390 Urinary tract infection, site not specified: Secondary | ICD-10-CM | POA: Diagnosis not present

## 2015-11-26 DIAGNOSIS — F0280 Dementia in other diseases classified elsewhere without behavioral disturbance: Secondary | ICD-10-CM | POA: Diagnosis not present

## 2015-11-26 DIAGNOSIS — Z79899 Other long term (current) drug therapy: Secondary | ICD-10-CM | POA: Diagnosis not present

## 2015-11-26 DIAGNOSIS — S3992XA Unspecified injury of lower back, initial encounter: Secondary | ICD-10-CM | POA: Diagnosis present

## 2015-11-26 DIAGNOSIS — T07XXXA Unspecified multiple injuries, initial encounter: Secondary | ICD-10-CM

## 2015-11-26 LAB — URINALYSIS COMPLETE WITH MICROSCOPIC (ARMC ONLY)
BILIRUBIN URINE: NEGATIVE
Glucose, UA: NEGATIVE mg/dL
Hgb urine dipstick: NEGATIVE
KETONES UR: NEGATIVE mg/dL
Nitrite: POSITIVE — AB
PH: 6 (ref 5.0–8.0)
Protein, ur: NEGATIVE mg/dL
Specific Gravity, Urine: 1.016 (ref 1.005–1.030)

## 2015-11-26 MED ORDER — LIDOCAINE HCL (PF) 1 % IJ SOLN
INTRAMUSCULAR | Status: AC
  Filled 2015-11-26: qty 5

## 2015-11-26 MED ORDER — NITROFURANTOIN MONOHYD MACRO 100 MG PO CAPS: 100.0000 mg | ORAL_CAPSULE | Freq: Two times a day (BID) | ORAL | Status: AC

## 2015-11-26 MED ORDER — CEFTRIAXONE SODIUM 1 G IJ SOLR
1.0000 g | Freq: Once | INTRAMUSCULAR | Status: AC
  Administered 2015-11-26: 1 g via INTRAMUSCULAR
  Filled 2015-11-26: qty 10

## 2015-11-26 NOTE — ED Notes (Signed)
Pt arrived via EMS from [facility] for unwitnessed fall. Pt found by staff with her head between the bed and the nightstand. No obvious trauma noted. EMS report staff report increasing weakness and history of UTI. EMS 165/93, 70HR, 97% RA, CBG 111.

## 2015-11-26 NOTE — ED Provider Notes (Signed)
[facility] Emergency Department Provider Note   ____________________________________________  Time seen: Approximately 7:00 PM  I have reviewed the triage vital signs and the nursing notes.   HISTORY  Chief Complaint Fall    HPI Catherine Frazier is a 82 y.o. female who had fallen at the nursing care facility. The son states that patient falls multiple times secondary to her dementia and she doesn't always use her walker. The nursing home did not find any injuries since the patient for evaluation. Patient is unable to give [place] any other significant history. Son states patient is acting her normal self.   Past Medical History  Diagnosis Date  . Dementia   . Anxiety   . Cancer ([place])   . Breast cancer ([place])   . Alzheimer's dementia   . Depression     There are no active problems to display for this patient.   History reviewed. No pertinent past surgical history.  Current Outpatient Rx  Name  Route  Sig  Dispense  Refill  . acetaminophen (TYLENOL) 500 MG tablet   Oral   Take 500 mg by mouth every 4 (four) hours as needed.         Marland Kitchen alum & mag hydroxide-simeth (MINTOX) 200-200-20 MG/5ML suspension   Oral   Take 30 mLs by mouth as needed for indigestion or heartburn. (not to exceed 4 doses in 24 hours)         . clobetasol cream (TEMOVATE) 0.05 %   Topical   Apply 1 application topically 2 (two) times daily as needed (dry, red skin).          Marland Kitchen guaifenesin (ROBITUSSIN) 100 MG/5ML syrup   Oral   Take 200 mg by mouth every 6 (six) hours as needed for cough. (not to exceed 4 doses in 24 hours)         . loperamide (IMODIUM) 2 MG capsule   Oral   Take 2 mg by mouth as needed for diarrhea or loose stools. With each loose stool for diarrhea (not to exceed 8 doses in 24 hours)         . LORazepam (ATIVAN) 0.5 MG tablet   Oral   Take 0.5 mg by mouth every 6 (six) hours as needed for anxiety.          . magnesium hydroxide (MILK OF  MAGNESIA) 400 MG/5ML suspension   Oral   Take 30 mLs by mouth at bedtime as needed for mild constipation.         . mineral oil-hydrophilic petrolatum (AQUAPHOR) ointment   Topical   Apply 1 application topically 2 (two) times daily. Apply to legs         . mirtazapine (REMERON) 15 MG tablet   Oral   Take 15 mg by mouth at bedtime.         Marland Kitchen Neomycin-Bacitracin-Polymyxin (TRIPLE ANTIBIOTIC) [phone] OINT   Apply externally   Apply topically. For minor skin tears or abrasions, clean area with normal saline, apply ointment, cover with bandaid or gauze and tape and change as needed until healed         . sertraline (ZOLOFT) 100 MG tablet   Oral   Take 100 mg by mouth at bedtime.         . nitrofurantoin, macrocrystal-monohydrate, (MACROBID) 100 MG capsule   Oral   Take 1 capsule (100 mg total) by mouth 2 (two) times daily.   20 capsule   0  Allergies Review of patient's allergies indicates no known allergies.  No family history on file.  Social History Social History  Substance Use Topics  . Smoking status: Never Smoker   . Smokeless tobacco: Never Used  . Alcohol Use: No    Review of Systems Pt unable to give any other significant history or ROS secondary to dementia.    10-point ROS otherwise negative.  ____________________________________________   PHYSICAL EXAM:  VITAL SIGNS: ED Triage Vitals  Enc Vitals Group     BP 11/26/15 1854 174/79 mmHg     Pulse Rate 11/26/15 1854 59     Resp 11/26/15 1854 18     Temp 11/26/15 1854 97.6 F (36.4 C)     Temp Source 11/26/15 1854 Oral     SpO2 11/26/15 1854 100 %     Weight 11/26/15 1854 173 lb (78.472 kg)     Height 11/26/15 1854 5\' 3"  (1.6 m)     Head Cir --      Peak Flow --      Pain Score --      Pain Loc --      Pain Edu? --      Excl. in [place]? --     Constitutional: Alert and pleasantly demented.. Well appearing and in no acute distress. Eyes: Conjunctivae are normal. PERRL.  EOMI. Head: Atraumatic. No notable injuries to the head or neck. Nose: No congestion/rhinnorhea. Mouth/Throat: Mucous membranes are moist.  Oropharynx non-erythematous. Neck: No stridor.   Cardiovascular: Normal rate, regular rhythm. Grossly normal heart sounds.  Good peripheral circulation. Respiratory: Normal respiratory effort.  No retractions. Lungs CTAB. Gastrointestinal: Soft and nontender. No distention. No abdominal bruits. No CVA tenderness. Musculoskeletal: Pt with mild tenderness to the right mid buttock and right lower back.  No edema.  No joint effusions. Neurologic:  Normal speech and language. No gross focal neurologic deficits are appreciated. No gait instability. Skin:  Skin is warm, dry and intact. No rash noted. Psychiatric: Patient pleasantly demented.  ____________________________________________   LABS (all labs ordered are listed, but only abnormal results are displayed)  Labs Reviewed  URINALYSIS COMPLETEWITH MICROSCOPIC ([place] ONLY) - Abnormal; Notable for the following:    Color, Urine YELLOW (*)    APPearance CLOUDY (*)    Nitrite POSITIVE (*)    Leukocytes, UA 3+ (*)    Bacteria, UA RARE (*)    Squamous Epithelial / LPF 0-5 (*)    All other components within normal limits   ____________________________________________  EKG ED ECG REPORT I, Ruby Cola, the attending physician, personally viewed and interpreted this ECG.   Date: 11/26/2015  EKG Time: 1851  Rate: 63  Rhythm: normal sinus rhythm  Axis: Normal  Intervals:Left atrial large but  ST&T Change: Poor R-wave progression and no significant ST or T-wave changes.   ____________________________________________  RADIOLOGY Dg Lumbar Spine 2-3 Views  11/26/2015  CLINICAL DATA:  Status post unwitnessed fall, with lower back pain. Initial encounter. EXAM: LUMBAR SPINE - 2-3 VIEW COMPARISON:  None. FINDINGS: There is no evidence of fracture or subluxation. Vertebral bodies demonstrate normal  height and alignment. Mild intervertebral disc space narrowing is noted along the lower lumbar spine, with associated endplate sclerotic change. The visualized bowel gas pattern is unremarkable in appearance; air and stool are noted within the colon. The sacroiliac joints are within normal limits. Postoperative change is noted at the pelvis. IMPRESSION: No evidence of fracture or dislocation. Electronically Signed   By: Garald Balding  M.D.   On: 11/26/2015 20:10   Dg Pelvis 1-2 Views  11/26/2015  CLINICAL DATA:  Status post unwitnessed fall. Pelvic pain. Initial encounter. EXAM: PELVIS - 1-2 VIEW COMPARISON:  Pelvic radiographs performed 09/24/2015 FINDINGS: There is no evidence of fracture or dislocation. Both femoral heads are seated normally within their respective acetabula. Minimal degenerative change is noted at the lower lumbar spine. The sacroiliac joints are unremarkable in appearance. The visualized bowel gas pattern is grossly unremarkable in appearance. Postoperative change is noted within the pelvis, including a bowel suture line. IMPRESSION: No evidence of fracture or dislocation. Electronically Signed   By: Garald Balding M.D.   On: 11/26/2015 20:09     ____________________________________________   PROCEDURES  Procedure(s) performed: none  Critical Care performed: No  ____________________________________________   INITIAL IMPRESSION / ASSESSMENT AND PLAN / ED COURSE  Pertinent labs & imaging results that were available during my care of the patient were reviewed by me and considered in my medical decision making (see chart for details).  10:16 PM Pt will get xrays of lumbar spine and pelvis.  10:16 PM Found the patient also had long history of urinary tract infections and that was another concern of the nursing care facility. Urinalysis was sent and patient deftly had a urinary tract infection and the urine was somewhat cloudy. Patient was given a shot of Rocephin here in  the emergency room and will be sent back to the care facility on Macrobid to take for her urinary tract infection. Urine culture will be sent. Patient to return immediately if condition worsens. ____________________________________________   FINAL CLINICAL IMPRESSION(S) / ED DIAGNOSES  Final diagnoses:  Fall, initial encounter  Multiple contusions  Contusion of buttock, initial encounter  Acute UTI      NEW MEDICATIONS STARTED DURING THIS VISIT:  New Prescriptions   NITROFURANTOIN, MACROCRYSTAL-MONOHYDRATE, (MACROBID) 100 MG CAPSULE    Take 1 capsule (100 mg total) by mouth 2 (two) times daily.     Note:  This document was prepared using Dragon voice recognition software and may include unintentional dictation errors.    Ruby Cola, MD 11/26/15 2216

## 2016-03-26 ENCOUNTER — Encounter: Payer: Self-pay | Admitting: Emergency Medicine

## 2016-03-26 ENCOUNTER — Emergency Department
Admission: EM | Admit: 2016-03-26 | Discharge: 2016-03-26 | Disposition: A | Discharge status: Home / Self Care | Payer: Medicare Other | Attending: Emergency Medicine | Admitting: Emergency Medicine

## 2016-03-26 DIAGNOSIS — Z791 Long term (current) use of non-steroidal anti-inflammatories (NSAID): Secondary | ICD-10-CM | POA: Diagnosis not present

## 2016-03-26 DIAGNOSIS — G309 Alzheimer's disease, unspecified: Secondary | ICD-10-CM | POA: Insufficient documentation

## 2016-03-26 DIAGNOSIS — R8299 Other abnormal findings in urine: Secondary | ICD-10-CM | POA: Diagnosis present

## 2016-03-26 DIAGNOSIS — F0281 Dementia in other diseases classified elsewhere with behavioral disturbance: Secondary | ICD-10-CM | POA: Diagnosis not present

## 2016-03-26 DIAGNOSIS — Z79899 Other long term (current) drug therapy: Secondary | ICD-10-CM | POA: Diagnosis not present

## 2016-03-26 DIAGNOSIS — Z853 Personal history of malignant neoplasm of breast: Secondary | ICD-10-CM | POA: Diagnosis not present

## 2016-03-26 DIAGNOSIS — N390 Urinary tract infection, site not specified: Secondary | ICD-10-CM | POA: Diagnosis not present

## 2016-03-26 LAB — BASIC METABOLIC PANEL
Anion gap: 6 (ref 5–15)
BUN: 25 mg/dL — AB (ref 6–20)
CALCIUM: 9 mg/dL (ref 8.9–10.3)
CO2: 27 mmol/L (ref 22–32)
CREATININE: 0.99 mg/dL (ref 0.44–1.00)
Chloride: 102 mmol/L (ref 101–111)
GFR calc Af Amer: 57 mL/min — ABNORMAL LOW (ref >60)
GFR, EST NON AFRICAN AMERICAN: 49 mL/min — AB (ref >60)
Glucose, Bld: 99 mg/dL (ref 65–99)
POTASSIUM: 4.2 mmol/L (ref 3.5–5.1)
SODIUM: 135 mmol/L (ref 135–145)

## 2016-03-26 LAB — URINALYSIS COMPLETE WITH MICROSCOPIC (ARMC ONLY)
BILIRUBIN URINE: NEGATIVE
Bacteria, UA: NONE SEEN
GLUCOSE, UA: NEGATIVE mg/dL
KETONES UR: NEGATIVE mg/dL
Nitrite: NEGATIVE
Protein, ur: 30 mg/dL — AB
Specific Gravity, Urine: 1.016 (ref 1.005–1.030)
pH: 6 (ref 5.0–8.0)

## 2016-03-26 LAB — CBC WITH DIFFERENTIAL/PLATELET
Basophils Absolute: 0.1 10*3/uL (ref 0–0.1)
Basophils Relative: 1 %
EOS ABS: 0.3 10*3/uL (ref 0–0.7)
EOS PCT: 4 %
HCT: 37.5 % (ref 35.0–47.0)
Hemoglobin: 12.7 g/dL (ref 12.0–16.0)
LYMPHS ABS: 2.3 10*3/uL (ref 1.0–3.6)
Lymphocytes Relative: 25 %
MCH: 30.1 pg (ref 26.0–34.0)
MCHC: 33.9 g/dL (ref 32.0–36.0)
MCV: 88.5 fL (ref 80.0–100.0)
Monocytes Absolute: 0.9 10*3/uL (ref 0.2–0.9)
Monocytes Relative: 10 %
Neutro Abs: 5.5 10*3/uL (ref 1.4–6.5)
Neutrophils Relative %: 60 %
PLATELETS: 265 10*3/uL (ref 150–440)
RBC: 4.24 MIL/uL (ref 3.80–5.20)
RDW: 14.1 % (ref 11.5–14.5)
WBC: 9.1 10*3/uL (ref 3.6–11.0)

## 2016-03-26 LAB — TROPONIN I

## 2016-03-26 MED ORDER — CEFTRIAXONE SODIUM 1 G IJ SOLR
1.0000 g | Freq: Once | INTRAMUSCULAR | Status: AC
  Administered 2016-03-26: 1 g via INTRAVENOUS
  Filled 2016-03-26: qty 10

## 2016-03-26 MED ORDER — CEPHALEXIN 500 MG PO CAPS: 500.0000 mg | ORAL_CAPSULE | Freq: Three times a day (TID) | ORAL | 0 refills | Status: DC

## 2016-03-26 NOTE — ED Notes (Signed)
[facility] to notify staff of pt's pending d/c and prescription for UTI. Spoke with Clydene Fake [phone]), who acknowledged understanding.

## 2016-03-26 NOTE — Discharge Instructions (Signed)
Please seek medical attention for any high fevers, chest pain, shortness of breath, change in behavior, persistent vomiting, bloody stool or any other new or concerning symptoms.  

## 2016-03-26 NOTE — ED Provider Notes (Signed)
[facility] Emergency Department Provider Note   ____________________________________________   I have reviewed the triage vital signs and the nursing notes.   HISTORY  Chief Complaint Fall   History limited by: Dementia   HPI Catherine Frazier is a 82 y.o. female with history of dementia who comes from living facility after being found on the ground. The patient was not able to give any history about why she might have been on the ground. She does deny any chest pain, abdominal pain, or any pain at all. EMS state that she was able to bear weight on her legs but needed help with balance when they tried to walk her. They were told that she has a history of frequent falls. Additionally they state that she did wet herself and that the urine had a foul odor.   Past Medical History:  Diagnosis Date  . Alzheimer's dementia   . Anxiety   . Breast cancer ([place])   . Cancer ([place])   . Dementia   . Depression     There are no active problems to display for this patient.   History reviewed. No pertinent surgical history.  Prior to Admission medications   Medication Sig Start Date End Date Taking? Authorizing Provider  acetaminophen (TYLENOL) 500 MG tablet Take 500 mg by mouth every 4 (four) hours as needed.    Historical Provider, MD  alum & mag hydroxide-simeth (MINTOX) 200-200-20 MG/5ML suspension Take 30 mLs by mouth as needed for indigestion or heartburn. (not to exceed 4 doses in 24 hours)    Historical Provider, MD  clobetasol cream (TEMOVATE) [id number] % Apply 1 application topically 2 (two) times daily as needed (dry, red skin).     Historical Provider, MD  guaifenesin (ROBITUSSIN) 100 MG/5ML syrup Take 200 mg by mouth every 6 (six) hours as needed for cough. (not to exceed 4 doses in 24 hours)    Historical Provider, MD  loperamide (IMODIUM) 2 MG capsule Take 2 mg by mouth as needed for diarrhea or loose stools. With each loose stool for diarrhea (not to exceed 8  doses in 24 hours)    Historical Provider, MD  LORazepam (ATIVAN) 0.5 MG tablet Take 0.5 mg by mouth every 6 (six) hours as needed for anxiety.     Historical Provider, MD  magnesium hydroxide (MILK OF MAGNESIA) 400 MG/5ML suspension Take 30 mLs by mouth at bedtime as needed for mild constipation.    Historical Provider, MD  mineral oil-hydrophilic petrolatum (AQUAPHOR) ointment Apply 1 application topically 2 (two) times daily. Apply to legs    Historical Provider, MD  mirtazapine (REMERON) 15 MG tablet Take 15 mg by mouth at bedtime.    Historical Provider, MD  Neomycin-Bacitracin-Polymyxin (TRIPLE ANTIBIOTIC) [phone] OINT Apply topically. For minor skin tears or abrasions, clean area with normal saline, apply ointment, cover with bandaid or gauze and tape and change as needed until healed    Historical Provider, MD  sertraline (ZOLOFT) 100 MG tablet Take 100 mg by mouth at bedtime.    Historical Provider, MD    Allergies Review of patient's allergies indicates no known allergies.  History reviewed. No pertinent family history.  Social History Social History  Substance Use Topics  . Smoking status: Never Smoker  . Smokeless tobacco: Never Used  . Alcohol use No    Review of Systems  Constitutional: Negative for fever. Cardiovascular: Negative for chest pain. Respiratory: Negative for shortness of breath. Gastrointestinal: Negative for abdominal pain, vomiting and diarrhea.  Neurological: Negative for headaches, focal weakness or numbness.   10-point ROS otherwise negative.  ____________________________________________   PHYSICAL EXAM:  VITAL SIGNS: ED Triage Vitals  Enc Vitals Group     BP 03/26/16 1751 (!) 125/98     Pulse Rate 03/26/16 1751 68     Resp 03/26/16 1751 16     Temp 03/26/16 1751 98 F (36.7 C)     Temp Source 03/26/16 1751 Oral     SpO2 03/26/16 1751 100 %     Weight 03/26/16 1752 174 lb 8 oz (79.2 kg)   Constitutional: Awake and alert.  Pleasantly demented. Oriented to name only. Eyes: Conjunctivae are normal. Normal extraocular movements. ENT   Head: Normocephalic and atraumatic.   Nose: No congestion/rhinnorhea.   Mouth/Throat: Mucous membranes are moist.   Neck: No stridor. No midline tenderness.  Hematological/Lymphatic/Immunilogical: No cervical lymphadenopathy. Cardiovascular: Normal rate, regular rhythm.  No murmurs, rubs, or gallops. Respiratory: Normal respiratory effort without tachypnea nor retractions. Breath sounds are clear and equal bilaterally. No wheezes/rales/rhonchi. Gastrointestinal: Soft and nontender. No distention.  Genitourinary: Deferred Musculoskeletal: Normal range of motion in all extremities. No lower extremity edema. Neurologic:  Pleasantly demented. Oriented to self. Appears to move all extremities.  Skin:  Skin is warm, dry and intact. No rash noted. Psychiatric: Mood and affect are normal. Speech and behavior are normal. Patient exhibits appropriate insight and judgment.  ____________________________________________    LABS (pertinent positives/negatives)  Labs Reviewed  BASIC METABOLIC PANEL - Abnormal; Notable for the following:       Result Value   BUN 25 (*)    GFR calc non Af Amer 49 (*)    GFR calc Af Amer 57 (*)    All other components within normal limits  URINALYSIS COMPLETEWITH MICROSCOPIC ([facility] ONLY) - Abnormal; Notable for the following:    Color, Urine YELLOW (*)    APPearance CLEAR (*)    Hgb urine dipstick 1+ (*)    Protein, ur 30 (*)    Leukocytes, UA 2+ (*)    Squamous Epithelial / LPF 0-5 (*)    All other components within normal limits  URINE CULTURE  CBC WITH DIFFERENTIAL/PLATELET  TROPONIN I     ____________________________________________   EKG  I, Nance Pear, attending physician, personally viewed and interpreted this EKG  EKG Time: 1757 Rate: 67 Rhythm: normal sinus rhythm Axis: normal Intervals: qtc 435 QRS: narrow, q  waves V1, V3, III ST changes: no st elevation Impression: abnormal ekg   ____________________________________________    RADIOLOGY  None  ____________________________________________   PROCEDURES  Procedures  ____________________________________________   INITIAL IMPRESSION / ASSESSMENT AND PLAN / ED COURSE  Pertinent labs & imaging results that were available during my care of the patient were reviewed by me and considered in my medical decision making (see chart for details).  Patient presented to the emergency department today because of concerns for being found on the ground and her living facility. Patient does not complain of any pain and no traumatic injuries identified on physical exam. Patient's urine is concerning for UTI. Son does think that she likely has a UTI. I did discuss CT head with the son however he was comfortable deferring at this time. He states that she was acting at her baseline. She is not on any blood thinners. Did discuss return precautions with the son. We will discharge back to living facility. ____________________________________________   FINAL CLINICAL IMPRESSION(S) / ED DIAGNOSES  Final diagnoses:  UTI (lower urinary  tract infection)     Note: This dictation was prepared with Dragon dictation. Any transcriptional errors that result from this process are unintentional    Nance Pear, MD 03/26/16 2036

## 2016-03-26 NOTE — ED Triage Notes (Signed)
Patient arrives via [facility] from [facility]. Patient was "found sitting on the floor". EMS states "patient is normally ambulatory, however we had extreme difficulty having her stand, her balance is off".

## 2016-03-28 LAB — URINE CULTURE: CULTURE: NO GROWTH

## 2016-06-25 ENCOUNTER — Emergency Department
Admission: EM | Admit: 2016-06-25 | Discharge: 2016-06-25 | Disposition: A | Discharge status: Home / Self Care | Payer: Medicare Other | Attending: Emergency Medicine | Admitting: Emergency Medicine

## 2016-06-25 ENCOUNTER — Encounter: Payer: Self-pay | Admitting: Emergency Medicine

## 2016-06-25 ENCOUNTER — Emergency Department: Payer: Medicare Other

## 2016-06-25 DIAGNOSIS — W19XXXA Unspecified fall, initial encounter: Secondary | ICD-10-CM | POA: Insufficient documentation

## 2016-06-25 DIAGNOSIS — M25551 Pain in right hip: Secondary | ICD-10-CM | POA: Diagnosis not present

## 2016-06-25 DIAGNOSIS — G308 Other Alzheimer's disease: Secondary | ICD-10-CM | POA: Diagnosis not present

## 2016-06-25 DIAGNOSIS — Y939 Activity, unspecified: Secondary | ICD-10-CM | POA: Insufficient documentation

## 2016-06-25 DIAGNOSIS — Y999 Unspecified external cause status: Secondary | ICD-10-CM | POA: Insufficient documentation

## 2016-06-25 DIAGNOSIS — N390 Urinary tract infection, site not specified: Secondary | ICD-10-CM | POA: Diagnosis not present

## 2016-06-25 DIAGNOSIS — F0280 Dementia in other diseases classified elsewhere without behavioral disturbance: Secondary | ICD-10-CM | POA: Insufficient documentation

## 2016-06-25 DIAGNOSIS — Y92129 Unspecified place in nursing home as the place of occurrence of the external cause: Secondary | ICD-10-CM | POA: Diagnosis not present

## 2016-06-25 DIAGNOSIS — Z853 Personal history of malignant neoplasm of breast: Secondary | ICD-10-CM | POA: Insufficient documentation

## 2016-06-25 DIAGNOSIS — S79911A Unspecified injury of right hip, initial encounter: Secondary | ICD-10-CM | POA: Diagnosis present

## 2016-06-25 DIAGNOSIS — Z79899 Other long term (current) drug therapy: Secondary | ICD-10-CM | POA: Insufficient documentation

## 2016-06-25 DIAGNOSIS — Y92009 Unspecified place in unspecified non-institutional (private) residence as the place of occurrence of the external cause: Secondary | ICD-10-CM

## 2016-06-25 LAB — URINALYSIS, COMPLETE (UACMP) WITH MICROSCOPIC
BILIRUBIN URINE: NEGATIVE
Glucose, UA: NEGATIVE mg/dL
Ketones, ur: NEGATIVE mg/dL
Nitrite: NEGATIVE
Protein, ur: NEGATIVE mg/dL
SPECIFIC GRAVITY, URINE: 1.015 (ref 1.005–1.030)
pH: 6 (ref 5.0–8.0)

## 2016-06-25 MED ORDER — CEPHALEXIN 500 MG PO CAPS: 500.0000 mg | ORAL_CAPSULE | Freq: Two times a day (BID) | ORAL | 0 refills | Status: DC

## 2016-06-25 NOTE — Discharge Instructions (Signed)
Your urine test today reveals an infection. Take Keflex as prescribed and follow up with your doctor this week.

## 2016-06-25 NOTE — ED Notes (Signed)
Pt incontinent of urine, cleaned, changed, new brief applied.

## 2016-06-25 NOTE — ED Provider Notes (Signed)
[facility] Emergency Department Provider Note  ____________________________________________  Time seen: Approximately 7:26 AM  I have reviewed the triage vital signs and the nursing notes.   HISTORY  Chief Complaint Fall (Pt. had unwitnessed fall at [facility].) Level 5 caveat:  Portions of the history and physical were unable to be obtained due to the patient's advanced chronic dementia   HPI Catherine Frazier is a 82 y.o. female the ED from [facility] for evaluation after unwitnessed fall. Patient was found on the floor next to her bed this morning. Patient admits to right hip pain and denies any other symptoms. No chest pain shortness of breath headache. Denies hitting her head. No nausea or vomiting.     Past Medical History:  Diagnosis Date  . Alzheimer's dementia   . Anxiety   . Breast cancer ([place])   . Cancer ([place])   . Dementia   . Depression      There are no active problems to display for this patient.    History reviewed. No pertinent surgical history.   Prior to Admission medications   Medication Sig Start Date End Date Taking? Authorizing Provider  acetaminophen (TYLENOL) 500 MG tablet Take 500 mg by mouth every 4 (four) hours as needed.   Yes Historical Provider, MD  ALPRAZolam (XANAX) 0.25 MG tablet Take 0.25 mg by mouth daily. Take 1 tab daily at 1500.   Yes Historical Provider, MD  alum & mag hydroxide-simeth (MINTOX) 200-200-20 MG/5ML suspension Take 30 mLs by mouth as needed for indigestion or heartburn. (not to exceed 4 doses in 24 hours)   Yes Historical Provider, MD  clobetasol cream (TEMOVATE) [id number] % Apply 1 application topically 2 (two) times daily as needed (dry, red skin).    Yes Historical Provider, MD  guaifenesin (ROBITUSSIN) 100 MG/5ML syrup Take 200 mg by mouth every 6 (six) hours as needed for cough. (not to exceed 4 doses in 24 hours)   Yes Historical Provider, MD  loperamide (IMODIUM) 2 MG capsule Take 2 mg by  mouth as needed for diarrhea or loose stools. With each loose stool for diarrhea (not to exceed 8 doses in 24 hours)   Yes Historical Provider, MD  magnesium hydroxide (MILK OF MAGNESIA) 400 MG/5ML suspension Take 30 mLs by mouth at bedtime as needed for mild constipation.   Yes Historical Provider, MD  mineral oil-hydrophilic petrolatum (AQUAPHOR) ointment Apply 1 application topically 2 (two) times daily. Apply to legs   Yes Historical Provider, MD  Neomycin-Bacitracin-Polymyxin (TRIPLE ANTIBIOTIC) [phone] OINT Apply topically. For minor skin tears or abrasions, clean area with normal saline, apply ointment, cover with bandaid or gauze and tape and change as needed until healed   Yes Historical Provider, MD  sertraline (ZOLOFT) 100 MG tablet Take 100 mg by mouth at bedtime.   Yes Historical Provider, MD  cephALEXin (KEFLEX) 500 MG capsule Take 1 capsule (500 mg total) by mouth 2 (two) times daily. 06/25/16   Carrie Mew, MD     Allergies Patient has no known allergies.   History reviewed. No pertinent family history.  Social History Social History  Substance Use Topics  . Smoking status: Never Smoker  . Smokeless tobacco: Never Used  . Alcohol use No    Review of Systems  Cardiovascular:   No chest pain. Respiratory:   No dyspnea  Gastrointestinal:   Negative for abdominal painOr vomiting  Genitourinary:   Incontinent of urine Musculoskeletal:   Right hip pain Neurological:   Negative  for headaches 10-point ROS otherwise negative.  ____________________________________________   PHYSICAL EXAM:  VITAL SIGNS: ED Triage Vitals  Enc Vitals Group     BP 06/25/16 0700 (!) 155/105     Pulse Rate 06/25/16 0700 75     Resp 06/25/16 0700 20     Temp --      Temp src --      SpO2 06/25/16 0700 98 %     Weight 06/25/16 0702 160 lb (72.6 kg)     Height 06/25/16 0702 5\' 5"  (1.651 m)     Head Circumference --      Peak Flow --      Pain Score --      Pain Loc --       Pain Edu? --      Excl. in [place]? --     Vital signs reviewed, nursing assessments reviewed.   Constitutional:   Awake alert and oriented to self. Well appearing and in no distress. Eyes:   No scleral icterus. No conjunctival pallor. PERRL. EOMI.  No nystagmus. ENT   Head:   Normocephalic and atraumatic.   Nose:   No congestion/rhinnorhea. No septal hematoma   Mouth/Throat:   MMM, no pharyngeal erythema. No peritonsillar mass.    Neck:   No stridor. No SubQ emphysema. No meningismus. No midline spinal tenderness. Full range of motion Hematological/Lymphatic/Immunilogical:   No cervical lymphadenopathy. Cardiovascular:   RRR. Symmetric bilateral radial and DP pulses.  No murmurs.  Respiratory:   Normal respiratory effort without tachypnea nor retractions. Breath sounds are clear and equal bilaterally. No wheezes/rales/rhonchi. Gastrointestinal:   Soft and nontender. Non distended. There is no CVA tenderness.  No rebound, rigidity, or guarding. Genitourinary:   deferred Musculoskeletal:   Right hip tenderness just distal to the greater trochanter. Patient is guarding the right leg. Pelvis stable. No midline spinal tenderness. No edema. Neurologic:    CN 2-10 normal. Motor grossly intact. No gross focal neurologic deficits are appreciated.    ____________________________________________    LABS (pertinent positives/negatives) (all labs ordered are listed, but only abnormal results are displayed) Labs Reviewed  URINALYSIS, COMPLETE ([facility]) WITH MICROSCOPIC - Abnormal; Notable for the following:       Result Value   Color, Urine YELLOW (*)    APPearance CLOUDY (*)    Hgb urine dipstick SMALL (*)    Leukocytes, UA LARGE (*)    Bacteria, UA MANY (*)    Squamous Epithelial / LPF 0-5 (*)    All other components within normal limits  URINE CULTURE   ____________________________________________   EKG  Interpreted by me Sinus rhythm rate of 73, normal axis and  intervals. Normal QRS ST segments and T waves  ____________________________________________    RADIOLOGY  X-ray right hip unremarkable  ____________________________________________   PROCEDURES Procedures  ____________________________________________   INITIAL IMPRESSION / ASSESSMENT AND PLAN / ED COURSE  Pertinent labs & imaging results that were available during my care of the patient were reviewed by me and considered in my medical decision making (see chart for details).  Patient presents with unwitnessed fall. Has advanced dementia. Only complaint is right hip pain, which corresponds with some tenderness on exam. We'll get an x-ray. Also check urinalysis as she has a history of UTI. Looking at the electronic medical record, baseline labs on her are generally unremarkable, and I don't see reason to perform any screening labs today. Not on blood thinners. No evidence of head trauma. CT imaging not warranted at  this time.     Clinical Course   9:10 AM Urinalysis positive for urinary tract infection. X-ray negative. We'll discharge home on Keflex. Urine culture sent. Follow up with primary care. The patient is not septic, no evidence of pyelonephritis at this time. ____________________________________________   FINAL CLINICAL IMPRESSION(S) / ED DIAGNOSES  Final diagnoses:  Pain of right hip joint  Fall in home, initial encounter  Lower urinary tract infectious disease      Current Discharge Medication List       Portions of this note were generated with dragon dictation software. Dictation errors may occur despite best attempts at proofreading.    Carrie Mew, MD 06/25/16 [phone]

## 2016-06-25 NOTE — ED Triage Notes (Signed)
Pt. Had unwitnesed fall at [facility]  Pt. Found on floor in fetal position.  Pt. Favors rt. Hip. Pt. Has hx of dementia.

## 2016-06-27 LAB — URINE CULTURE: Culture: >=100000 — AB

## 2016-06-28 NOTE — Progress Notes (Signed)
ED Antimicrobial Stewardship Positive Culture Follow Up   Catherine Frazier is an 82 y.o. female who presented to [facility] on 06/25/2016 with a chief complaint of  Chief Complaint  Patient presents with  . Fall    Pt. had unwitnessed fall at [facility].    Recent Results (from the past 720 hour(s))  Urine culture     Status: Abnormal   Collection Time: 06/25/16  8:52 AM  Result Value Ref Range Status   Specimen Description URINE, RANDOM  Final   Special Requests NONE  Final   Culture (A)  Final    >=100,000 COLONIES/mL ESCHERICHIA COLI Confirmed Extended Spectrum Beta-Lactamase Producer (ESBL) Performed at [facility]    Report Status 06/27/2016 FINAL  Final   Organism ID, Bacteria ESCHERICHIA COLI (A)  Final      Susceptibility   Escherichia coli - MIC*    AMPICILLIN >=32 RESISTANT Resistant     CEFAZOLIN >=64 RESISTANT Resistant     CEFTRIAXONE >=64 RESISTANT Resistant     CIPROFLOXACIN >=4 RESISTANT Resistant     GENTAMICIN <=1 SENSITIVE Sensitive     IMIPENEM <=0.25 SENSITIVE Sensitive     NITROFURANTOIN <=16 SENSITIVE Sensitive     TRIMETH/SULFA >=320 RESISTANT Resistant     AMPICILLIN/SULBACTAM 16 INTERMEDIATE Intermediate     PIP/TAZO <=4 SENSITIVE Sensitive     Extended ESBL POSITIVE Resistant     * >=100,000 COLONIES/mL ESCHERICHIA COLI    [x]  Treated with cephalexin, organism resistant to prescribed antimicrobial  Confirmed that patient is a resident at [facility]. Faxed ED culture result to [facility] for physician review.  Lenis Noon, PharmD Clinical Pharmacist 06/28/2016, 2:26 PM

## 2016-07-19 ENCOUNTER — Inpatient Hospital Stay: Payer: Medicare Other

## 2016-07-19 ENCOUNTER — Emergency Department: Payer: Medicare Other

## 2016-07-19 ENCOUNTER — Inpatient Hospital Stay
Admission: EM | Admit: 2016-07-19 | Discharge: 2016-07-23 | DRG: 064 | Disposition: A | Discharge status: Hospice | Payer: Medicare Other | Attending: Internal Medicine | Admitting: Internal Medicine

## 2016-07-19 ENCOUNTER — Encounter: Payer: Self-pay | Admitting: Emergency Medicine

## 2016-07-19 DIAGNOSIS — G309 Alzheimer's disease, unspecified: Secondary | ICD-10-CM | POA: Diagnosis present

## 2016-07-19 DIAGNOSIS — R29722 NIHSS score 22: Secondary | ICD-10-CM | POA: Diagnosis present

## 2016-07-19 DIAGNOSIS — F32A Depression, unspecified: Secondary | ICD-10-CM | POA: Diagnosis present

## 2016-07-19 DIAGNOSIS — N179 Acute kidney failure, unspecified: Secondary | ICD-10-CM | POA: Diagnosis present

## 2016-07-19 DIAGNOSIS — I63412 Cerebral infarction due to embolism of left middle cerebral artery: Principal | ICD-10-CM | POA: Diagnosis present

## 2016-07-19 DIAGNOSIS — J111 Influenza due to unidentified influenza virus with other respiratory manifestations: Secondary | ICD-10-CM | POA: Diagnosis present

## 2016-07-19 DIAGNOSIS — F0280 Dementia in other diseases classified elsewhere without behavioral disturbance: Secondary | ICD-10-CM | POA: Diagnosis present

## 2016-07-19 DIAGNOSIS — E860 Dehydration: Secondary | ICD-10-CM | POA: Diagnosis present

## 2016-07-19 DIAGNOSIS — I639 Cerebral infarction, unspecified: Secondary | ICD-10-CM

## 2016-07-19 DIAGNOSIS — F329 Major depressive disorder, single episode, unspecified: Secondary | ICD-10-CM | POA: Diagnosis present

## 2016-07-19 DIAGNOSIS — Z853 Personal history of malignant neoplasm of breast: Secondary | ICD-10-CM | POA: Diagnosis not present

## 2016-07-19 DIAGNOSIS — Z66 Do not resuscitate: Secondary | ICD-10-CM | POA: Diagnosis not present

## 2016-07-19 DIAGNOSIS — F419 Anxiety disorder, unspecified: Secondary | ICD-10-CM | POA: Diagnosis present

## 2016-07-19 DIAGNOSIS — E870 Hyperosmolality and hypernatremia: Secondary | ICD-10-CM | POA: Diagnosis not present

## 2016-07-19 DIAGNOSIS — E43 Unspecified severe protein-calorie malnutrition: Secondary | ICD-10-CM | POA: Diagnosis present

## 2016-07-19 DIAGNOSIS — Z7401 Bed confinement status: Secondary | ICD-10-CM | POA: Diagnosis not present

## 2016-07-19 DIAGNOSIS — R4182 Altered mental status, unspecified: Secondary | ICD-10-CM

## 2016-07-19 DIAGNOSIS — Z515 Encounter for palliative care: Secondary | ICD-10-CM

## 2016-07-19 DIAGNOSIS — Z6822 Body mass index (BMI) 22.0-22.9, adult: Secondary | ICD-10-CM | POA: Diagnosis not present

## 2016-07-19 DIAGNOSIS — Z7189 Other specified counseling: Secondary | ICD-10-CM | POA: Diagnosis not present

## 2016-07-19 DIAGNOSIS — E785 Hyperlipidemia, unspecified: Secondary | ICD-10-CM | POA: Diagnosis present

## 2016-07-19 DIAGNOSIS — F0390 Unspecified dementia without behavioral disturbance: Secondary | ICD-10-CM | POA: Diagnosis present

## 2016-07-19 DIAGNOSIS — R64 Cachexia: Secondary | ICD-10-CM | POA: Diagnosis present

## 2016-07-19 DIAGNOSIS — M6281 Muscle weakness (generalized): Secondary | ICD-10-CM

## 2016-07-19 DIAGNOSIS — R0682 Tachypnea, not elsewhere classified: Secondary | ICD-10-CM

## 2016-07-19 LAB — COMPREHENSIVE METABOLIC PANEL
ALBUMIN: 3.1 g/dL — AB (ref 3.5–5.0)
ALT: 20 U/L (ref 14–54)
ANION GAP: UNDETERMINED (ref 5–15)
AST: 46 U/L — ABNORMAL HIGH (ref 15–41)
Alkaline Phosphatase: 82 U/L (ref 38–126)
BUN: 95 mg/dL — ABNORMAL HIGH (ref 6–20)
CO2: 23 mmol/L (ref 22–32)
Calcium: 9.2 mg/dL (ref 8.9–10.3)
Creatinine, Ser: 2.96 mg/dL — ABNORMAL HIGH (ref 0.44–1.00)
GFR calc non Af Amer: 13 mL/min — ABNORMAL LOW (ref >60)
GFR, EST AFRICAN AMERICAN: 15 mL/min — AB (ref >60)
GLUCOSE: 155 mg/dL — AB (ref 65–99)
Potassium: 4.1 mmol/L (ref 3.5–5.1)
Sodium: 170 mmol/L (ref 135–145)
Total Bilirubin: 0.9 mg/dL (ref 0.3–1.2)
Total Protein: 7 g/dL (ref 6.5–8.1)

## 2016-07-19 LAB — CBC WITH DIFFERENTIAL/PLATELET
Basophils Absolute: 0 10*3/uL (ref 0–0.1)
Basophils Relative: 0 %
Eosinophils Absolute: 0 10*3/uL (ref 0–0.7)
Eosinophils Relative: 0 %
HEMATOCRIT: 44 % (ref 35.0–47.0)
HEMOGLOBIN: 14.1 g/dL (ref 12.0–16.0)
LYMPHS ABS: 1.4 10*3/uL (ref 1.0–3.6)
Lymphocytes Relative: 6 %
MCH: 28.4 pg (ref 26.0–34.0)
MCHC: 32.1 g/dL (ref 32.0–36.0)
MCV: 88.4 fL (ref 80.0–100.0)
MONO ABS: 1.2 10*3/uL — AB (ref 0.2–0.9)
MONOS PCT: 5 %
NEUTROS ABS: 19.3 10*3/uL — AB (ref 1.4–6.5)
NEUTROS PCT: 89 %
Platelets: 315 10*3/uL (ref 150–440)
RBC: 4.97 MIL/uL (ref 3.80–5.20)
RDW: 15.5 % — ABNORMAL HIGH (ref 11.5–14.5)
WBC: 21.9 10*3/uL — ABNORMAL HIGH (ref 3.6–11.0)

## 2016-07-19 LAB — URINALYSIS, COMPLETE (UACMP) WITH MICROSCOPIC
BACTERIA UA: NONE SEEN
Bilirubin Urine: NEGATIVE
GLUCOSE, UA: NEGATIVE mg/dL
KETONES UR: NEGATIVE mg/dL
NITRITE: NEGATIVE
Protein, ur: NEGATIVE mg/dL
Specific Gravity, Urine: 1.018 (ref 1.005–1.030)
Squamous Epithelial / LPF: NONE SEEN
pH: 5 (ref 5.0–8.0)

## 2016-07-19 LAB — TROPONIN I: Troponin I: 0.25 ng/mL (ref <0.03)

## 2016-07-19 LAB — INFLUENZA PANEL BY PCR (TYPE A & B)
INFLAPCR: POSITIVE — AB
Influenza B By PCR: NEGATIVE

## 2016-07-19 LAB — LACTIC ACID, PLASMA: Lactic Acid, Venous: 2.1 mmol/L (ref 0.5–1.9)

## 2016-07-19 MED ORDER — SODIUM CHLORIDE 0.45 % IV SOLN
INTRAVENOUS | Status: DC
  Administered 2016-07-19 – 2016-07-20 (×3): via INTRAVENOUS
  Administered 2016-07-20: 1000 mL via INTRAVENOUS

## 2016-07-19 NOTE — H&P (Addendum)
Catherine Frazier at [place] NAME: Catherine Frazier    MR#:  [id number]  DATE OF BIRTH:  11/12/1925  DATE OF ADMISSION:  07/19/2016  PRIMARY CARE PHYSICIAN: Pcp Not In System   REQUESTING/REFERRING PHYSICIAN: Clearnce Hasten, MD  CHIEF COMPLAINT:   Chief Complaint  Patient presents with  . Influenza    HISTORY OF PRESENT ILLNESS:  Catherine Frazier  is a 83 y.o. female who presents From nursing facility for increased lethargy and suspicion of dehydration and possibly influenza. On evaluation here the patient was found to have new MCA territory stroke. She is unable to contribute to her history of present illness, collateral information is taken from her family who is at bedside. It seems that her standard baseline cognitive functioning is pretty poor. Hospitals were called for admission and further evaluation  PAST MEDICAL HISTORY:   Past Medical History:  Diagnosis Date  . Alzheimer's dementia   . Anxiety   . Breast cancer ([place])   . Cancer ([place])   . Dementia   . Depression     PAST SURGICAL HISTORY:   Past Surgical History:  Procedure Laterality Date  . NO PAST SURGERIES      SOCIAL HISTORY:   Social History  Substance Use Topics  . Smoking status: Never Smoker  . Smokeless tobacco: Never Used  . Alcohol use No    FAMILY HISTORY:   Family History  Problem Relation Age of Onset  . Family history unknown: Yes    DRUG ALLERGIES:  No Known Allergies  MEDICATIONS AT HOME:   Prior to Admission medications   Medication Sig Start Date End Date Taking? Authorizing Provider  acetaminophen (TYLENOL) 500 MG tablet Take 500 mg by mouth every 4 (four) hours as needed.    Historical Provider, MD  ALPRAZolam Duanne Moron) 0.25 MG tablet Take 0.25 mg by mouth daily. Take 1 tab daily at 1500.    Historical Provider, MD  alum & mag hydroxide-simeth (MINTOX) 200-200-20 MG/5ML suspension Take 30 mLs by mouth as needed for indigestion or  heartburn. (not to exceed 4 doses in 24 hours)    Historical Provider, MD  cephALEXin (KEFLEX) 500 MG capsule Take 1 capsule (500 mg total) by mouth 2 (two) times daily. 06/25/16   Carrie Mew, MD  clobetasol cream (TEMOVATE) [id number] % Apply 1 application topically 2 (two) times daily as needed (dry, red skin).     Historical Provider, MD  guaifenesin (ROBITUSSIN) 100 MG/5ML syrup Take 200 mg by mouth every 6 (six) hours as needed for cough. (not to exceed 4 doses in 24 hours)    Historical Provider, MD  loperamide (IMODIUM) 2 MG capsule Take 2 mg by mouth as needed for diarrhea or loose stools. With each loose stool for diarrhea (not to exceed 8 doses in 24 hours)    Historical Provider, MD  magnesium hydroxide (MILK OF MAGNESIA) 400 MG/5ML suspension Take 30 mLs by mouth at bedtime as needed for mild constipation.    Historical Provider, MD  mineral oil-hydrophilic petrolatum (AQUAPHOR) ointment Apply 1 application topically 2 (two) times daily. Apply to legs    Historical Provider, MD  Neomycin-Bacitracin-Polymyxin (TRIPLE ANTIBIOTIC) [id number] OINT Apply topically. For minor skin tears or abrasions, clean area with normal saline, apply ointment, cover with bandaid or gauze and tape and change as needed until healed    Historical Provider, MD  sertraline (ZOLOFT) 100 MG tablet Take 100 mg by mouth at bedtime.    Historical Provider,  MD    REVIEW OF SYSTEMS:  Review of Systems  Unable to perform ROS: Dementia     VITAL SIGNS:   Vitals:   07/19/16 2039  BP: 129/74  Pulse: 92  Resp: 20  Temp: 97.6 F (36.4 C)  TempSrc: Oral  SpO2: 92%  Weight: 58.9 kg (129 lb 12.8 oz)  Height: 5\' 5"  (1.651 m)   Wt Readings from Last 3 Encounters:  07/19/16 58.9 kg (129 lb 12.8 oz)  06/25/16 72.6 kg (160 lb)  03/26/16 79.2 kg (174 lb 8 oz)    PHYSICAL EXAMINATION:  Physical Exam  Vitals reviewed. Constitutional: She appears well-developed and well-nourished. No distress.  HENT:  Head:  Normocephalic and atraumatic.  Mouth/Throat: Oropharynx is clear and moist.  Eyes: Conjunctivae and EOM are normal. Pupils are equal, round, and reactive to light. No scleral icterus.  Neck: Normal range of motion. Neck supple. No JVD present. No thyromegaly present.  Cardiovascular: Normal rate, regular rhythm and intact distal pulses.  Exam reveals no gallop and no friction rub.   Murmur heard. Respiratory: Effort normal and breath sounds normal. No respiratory distress. She has no wheezes. She has no rales.  GI: Soft. Bowel sounds are normal. She exhibits no distension. There is no tenderness.  Musculoskeletal: Normal range of motion. She exhibits no edema.  No arthritis, no gout  Lymphadenopathy:    She has no cervical adenopathy.  Neurological:  Unable to assess due to patient condition  Skin: Skin is warm and dry. No rash noted. No erythema.  Psychiatric:  Unable to assess due to patient condition    LABORATORY PANEL:   CBC  Recent Labs Lab 07/19/16 2102  WBC 21.9*  HGB 14.1  HCT 44.0  PLT 315   ------------------------------------------------------------------------------------------------------------------  Chemistries  No results for input(s): NA, K, CL, CO2, GLUCOSE, BUN, CREATININE, CALCIUM, MG, AST, ALT, ALKPHOS, BILITOT in the last 168 hours.  Invalid input(s): GFRCGP ------------------------------------------------------------------------------------------------------------------  Cardiac Enzymes No results for input(s): TROPONINI in the last 168 hours. ------------------------------------------------------------------------------------------------------------------  RADIOLOGY:  Ct Head Wo Contrast  Result Date: 07/19/2016 CLINICAL DATA:  Lethargy, decreased appetite and diarrhea beginning yesterday. Altered mental status, possible flu. History of dementia, breast cancer. EXAM: CT HEAD WITHOUT CONTRAST TECHNIQUE: Contiguous axial images were obtained from  the base of the skull through the vertex without intravenous contrast. COMPARISON:  CT HEAD September 24, 2015 FINDINGS: BRAIN: Blurring of the LEFT temporal parietal gray-white matter differentiation with local sulcal effacement. Background of severe ventriculomegaly with disproportionate temporal lobe volume loss compatible with neurodegenerative disease. No intraparenchymal hemorrhage, mass effect or midline shift. Old LEFT caudate head lacunar infarct. Stable chronic small vessel ischemic disease. No abnormal extra-axial fluid collections. Basal cisterns are patent. VASCULAR: Mild calcific atherosclerosis of the carotid siphons. SKULL: No skull fracture. No significant scalp soft tissue swelling. SINUSES/ORBITS: The mastoid air-cells and included paranasal sinuses are well-aerated.Soft tissue within the external auditory canals most compatible with cerumen. The included ocular globes and orbital contents are non-suspicious. OTHER: None. IMPRESSION: Acute to subacute moderate LEFT MCA territory nonhemorrhagic infarct. Chronic changes of neurodegenerative disease and, old LEFT caudate head lacunar infarct. Acute findings discussed with and reconfirmed by Dr.Pina Sirianni SCHAEVITZ on 07/19/2016 at 9:07 pm. Electronically Signed   By: Elon Alas M.D.   On: 07/19/2016 21:08   Dg Chest Port 1 View  Result Date: 07/19/2016 CLINICAL DATA:  Possible flu decreased appetite and lethargy EXAM: PORTABLE CHEST 1 VIEW COMPARISON:  05/09/2015 FINDINGS: The lungs are hyperinflated. No  acute infiltrate or effusion. Heart size slightly enlarged. Atherosclerosis of the aorta. Focal convex opacity at the lower chest could relate to tortuous aorta or possibly a hiatal hernia. IMPRESSION: No acute infiltrate or edema. Convex opacity at the lower chest may reflect tortuous aorta versus hiatal hernia Electronically Signed   By: Donavan Foil M.D.   On: 07/19/2016 20:57    EKG:   Orders placed or performed during the hospital  encounter of 07/19/16  . ED EKG 12-Lead  . ED EKG 12-Lead  . EKG 12-Lead  . EKG 12-Lead    IMPRESSION AND PLAN:  Principal Problem:   Stroke [facility]) - admitted per stroke admission orders set with appropriate imaging, labs, and consults including neurology consult. Active Problems:   Influenza - continue tamiflu   Dementia - Baseline is seems that the patient is mostly nonmobile, getting around only some in a wheelchair, mostly nonverbal, and often does not recognize even close loved ones.   Anxiety - home dose anxiolytics   Depression - continue home meds  All the records are reviewed and case discussed with ED provider. Management plans discussed with the patient and/or family.  DVT PROPHYLAXIS: SubQ lovenox  GI PROPHYLAXIS: None  ADMISSION STATUS: Inpatient  CODE STATUS: Full, the family did state initially that they would likely not want her intubated, but then they were unsure as to what measures they would or wouldn't want taken. Her son is her legal guardian, and will likely need a repeat conversation about CODE STATUS after his son about the topic some more. Code Status History    This patient does not have a recorded code status. Please follow your organizational policy for patients in this situation.      TOTAL TIME TAKING CARE OF THIS PATIENT: 45 minutes.    Norinne Jeane [place] 07/19/2016, 9:56 PM  [facility]  Office  [phone]  CC: Primary care physician; Pcp Not In System

## 2016-07-19 NOTE — ED Triage Notes (Signed)
Per acems: pt. From [facility] for possible flu. Decreased appetite, lethargic, diarrhea since yesterday.

## 2016-07-19 NOTE — ED Notes (Signed)
Pt in CT via stretcher  

## 2016-07-19 NOTE — ED Provider Notes (Addendum)
[facility] Emergency Department Provider Note  ____________________________________________   First MD Initiated Contact with Patient 07/19/16 2042     (approximate)  I have reviewed the triage vital signs and the nursing notes.   HISTORY  Chief Complaint Influenza   HPI Catherine Frazier is a 83 y.o. female with a history of dementia as well as depression who is presenting to the emergency department with altered mental status of the past 1-2 days. Per EMS, she was evaluated at her skilled nursing facility via the attending physician who thought she had flu and started her on Tamiflu. However, this evening she continued to have decreased mentation and would not eat her evening meal. She was then transferred into the emergency department for further evaluation.  Her family is at the bedside and said that normally she has fluctuating mentation and some days has unintelligible speech.   Past Medical History:  Diagnosis Date  . Alzheimer's dementia   . Anxiety   . Breast cancer ([place])   . Cancer ([place])   . Dementia   . Depression     There are no active problems to display for this patient.   Past Surgical History:  Procedure Laterality Date  . NO PAST SURGERIES      Prior to Admission medications   Medication Sig Start Date End Date Taking? Authorizing Provider  acetaminophen (TYLENOL) 500 MG tablet Take 500 mg by mouth every 4 (four) hours as needed.    Historical Provider, MD  ALPRAZolam Duanne Moron) 0.25 MG tablet Take 0.25 mg by mouth daily. Take 1 tab daily at 1500.    Historical Provider, MD  alum & mag hydroxide-simeth (MINTOX) 200-200-20 MG/5ML suspension Take 30 mLs by mouth as needed for indigestion or heartburn. (not to exceed 4 doses in 24 hours)    Historical Provider, MD  cephALEXin (KEFLEX) 500 MG capsule Take 1 capsule (500 mg total) by mouth 2 (two) times daily. 06/25/16   Carrie Mew, MD  clobetasol cream (TEMOVATE) [id number] % Apply 1  application topically 2 (two) times daily as needed (dry, red skin).     Historical Provider, MD  guaifenesin (ROBITUSSIN) 100 MG/5ML syrup Take 200 mg by mouth every 6 (six) hours as needed for cough. (not to exceed 4 doses in 24 hours)    Historical Provider, MD  loperamide (IMODIUM) 2 MG capsule Take 2 mg by mouth as needed for diarrhea or loose stools. With each loose stool for diarrhea (not to exceed 8 doses in 24 hours)    Historical Provider, MD  magnesium hydroxide (MILK OF MAGNESIA) 400 MG/5ML suspension Take 30 mLs by mouth at bedtime as needed for mild constipation.    Historical Provider, MD  mineral oil-hydrophilic petrolatum (AQUAPHOR) ointment Apply 1 application topically 2 (two) times daily. Apply to legs    Historical Provider, MD  Neomycin-Bacitracin-Polymyxin (TRIPLE ANTIBIOTIC) [phone] OINT Apply topically. For minor skin tears or abrasions, clean area with normal saline, apply ointment, cover with bandaid or gauze and tape and change as needed until healed    Historical Provider, MD  sertraline (ZOLOFT) 100 MG tablet Take 100 mg by mouth at bedtime.    Historical Provider, MD    Allergies Patient has no known allergies.  Family History  Problem Relation Age of Onset  . Family history unknown: Yes    Social History Social History  Substance Use Topics  . Smoking status: Never Smoker  . Smokeless tobacco: Never Used  . Alcohol use No  Review of Systems Level V caveat secondary to patient nonverbal.  ____________________________________________   PHYSICAL EXAM:  VITAL SIGNS:    Enc Vitals Group     BP 129/74     Pulse Rate 92     Resp 20     Temp 97.6 F (36.4 C)     Temp Source Oral     SpO2 92 %     Weight 129 lb 12.8 oz (58.9 kg)     Height 5\' 5"  (1.651 m)     Head Circumference      Peak Flow      Pain Score      Pain Loc      Pain Edu?      Excl. in [place]?     Constitutional: Alert and in no acute distress. Eyes: Conjunctivae are  normal. PERRL. EOMI. Head: Atraumatic. Nose: No congestion/rhinnorhea. Mouth/Throat: Mucous membranes are moist.   Neck: No stridor.   Cardiovascular: Normal rate, regular rhythm. Grossly normal heart sounds.   Respiratory: Normal respiratory effort.  No retractions. Lungs CTAB. Gastrointestinal: Moans when I palpate her abdomen in all 4 quadrants. No distention.  Musculoskeletal: No lower extremity tenderness nor edema.  No joint effusions. Neurologic:  Facial asymmetry. No gross focal neurologic deficits are appreciated. Moves all 4 extremities equally. Skin:  Skin is warm, dry and intact. No rash noted. Psychiatric: Mood and affect are normal. Speech and behavior are normal.  ____________________________________________   LABS (all labs ordered are listed, but only abnormal results are displayed)  Labs Reviewed  CBC WITH DIFFERENTIAL/PLATELET - Abnormal; Notable for the following:       Result Value   WBC 21.9 (*)    RDW 15.5 (*)    Neutro Abs 19.3 (*)    Monocytes Absolute 1.2 (*)    All other components within normal limits  CULTURE, BLOOD (ROUTINE X 2)  CULTURE, BLOOD (ROUTINE X 2)  URINE CULTURE  COMPREHENSIVE METABOLIC PANEL  LACTIC ACID, PLASMA  LACTIC ACID, PLASMA  TROPONIN I  URINALYSIS, COMPLETE (UACMP) WITH MICROSCOPIC  INFLUENZA PANEL BY PCR (TYPE A & B)   ____________________________________________  EKG  ED ECG REPORT I, Doran Stabler, the attending physician, personally viewed and interpreted this ECG.   Date: 07/19/2016  EKG Time: 2036  Rate: 95  Rhythm: normal sinus rhythm  Axis: Normal axis  Intervals:none  ST&T Change: No ST segment elevation or depression. No abnormal T-wave inversion.  ____________________________________________  RADIOLOGY  CT Head Wo Contrast (Final result)  Result time 07/19/16 [id number]  Final result by Elon Alas, MD (07/19/16 [id number])           Narrative:   CLINICAL DATA: Lethargy, decreased  appetite and diarrhea beginning yesterday. Altered mental status, possible flu. History of dementia, breast cancer.  EXAM: CT HEAD WITHOUT CONTRAST  TECHNIQUE: Contiguous axial images were obtained from the base of the skull through the vertex without intravenous contrast.  COMPARISON: CT HEAD September 24, 2015  FINDINGS: BRAIN: Blurring of the LEFT temporal parietal gray-white matter differentiation with local sulcal effacement. Background of severe ventriculomegaly with disproportionate temporal lobe volume loss compatible with neurodegenerative disease. No intraparenchymal hemorrhage, mass effect or midline shift. Old LEFT caudate head lacunar infarct. Stable chronic small vessel ischemic disease. No abnormal extra-axial fluid collections. Basal cisterns are patent.  VASCULAR: Mild calcific atherosclerosis of the carotid siphons.  SKULL: No skull fracture. No significant scalp soft tissue swelling.  SINUSES/ORBITS: The mastoid air-cells and included paranasal sinuses are  well-aerated.Soft tissue within the external auditory canals most compatible with cerumen. The included ocular globes and orbital contents are non-suspicious.  OTHER: None.  IMPRESSION: Acute to subacute moderate LEFT MCA territory nonhemorrhagic infarct.  Chronic changes of neurodegenerative disease and, old LEFT caudate head lacunar infarct.  Acute findings discussed with and reconfirmed by Dr.DAVID SCHAEVITZ on 07/19/2016 at 9:07 pm.   Electronically Signed By: Elon Alas M.D. On: 07/19/2016 21:08            DG Chest Port 1 View (Final result)  Result time 07/19/16 20:57:06  Final result by Madie Reno, MD (07/19/16 20:57:06)           Narrative:   CLINICAL DATA: Possible flu decreased appetite and lethargy  EXAM: PORTABLE CHEST 1 VIEW  COMPARISON: 05/09/2015  FINDINGS: The lungs are hyperinflated. No acute infiltrate or effusion. Heart size slightly enlarged.  Atherosclerosis of the aorta. Focal convex opacity at the lower chest could relate to tortuous aorta or possibly a hiatal hernia.  IMPRESSION: No acute infiltrate or edema. Convex opacity at the lower chest may reflect tortuous aorta versus hiatal hernia   Electronically Signed By: Donavan Foil M.D. On: 07/19/2016 20:57            ____________________________________________   PROCEDURES  Procedure(s) performed:   Procedures  Critical Care performed:   ____________________________________________   INITIAL IMPRESSION / ASSESSMENT AND PLAN / ED COURSE  Pertinent labs & imaging results that were available during my care of the patient were reviewed by me and considered in my medical decision making (see chart for details).    Clinical Course    ----------------------------------------- 9:47 PM on 07/19/2016 -----------------------------------------  Pending CAT scan of the abdomen at this time. Critical labs called to the emergency department of a sodium of 170. Also with a lactic of 2.1 and troponin of 0.25. We will start half normal saline. Signed out to Dr. Jannifer Franklin. Family understanding of plan for [facility] willing to comply.  ____________________________________________   FINAL CLINICAL IMPRESSION(S) / ED DIAGNOSES  Hypernatremia. Altered mental status. Left MCA infarct.    NEW MEDICATIONS STARTED DURING THIS VISIT:  New Prescriptions   No medications on file     Note:  This document was prepared using Dragon voice recognition software and may include unintentional dictation errors.    Orbie Pyo, MD 07/19/16 2148  Holding aspirin until CT abdomen is resulted.    Orbie Pyo, MD 07/19/16 2150

## 2016-07-20 ENCOUNTER — Inpatient Hospital Stay: Payer: Medicare Other

## 2016-07-20 ENCOUNTER — Inpatient Hospital Stay
Admit: 2016-07-20 | Discharge: 2016-07-20 | Disposition: A | Discharge status: Home / Self Care | Payer: Medicare Other | Attending: Internal Medicine | Admitting: Internal Medicine

## 2016-07-20 DIAGNOSIS — I639 Cerebral infarction, unspecified: Secondary | ICD-10-CM

## 2016-07-20 LAB — BASIC METABOLIC PANEL
BUN: 104 mg/dL — ABNORMAL HIGH (ref 6–20)
CO2: 26 mmol/L (ref 22–32)
Calcium: 8.7 mg/dL — ABNORMAL LOW (ref 8.9–10.3)
Chloride: >130 mmol/L (ref 101–111)
Creatinine, Ser: 2.71 mg/dL — ABNORMAL HIGH (ref 0.44–1.00)
GFR, EST AFRICAN AMERICAN: 17 mL/min — AB (ref >60)
GFR, EST NON AFRICAN AMERICAN: 15 mL/min — AB (ref >60)
GLUCOSE: 139 mg/dL — AB (ref 65–99)
POTASSIUM: 4 mmol/L (ref 3.5–5.1)
SODIUM: 163 mmol/L — AB (ref 135–145)

## 2016-07-20 LAB — LIPID PANEL
Cholesterol: 241 mg/dL — ABNORMAL HIGH (ref 0–200)
HDL: 31 mg/dL — ABNORMAL LOW (ref >40)
LDL Cholesterol: 163 mg/dL — ABNORMAL HIGH (ref 0–99)
Total CHOL/HDL Ratio: 7.8 RATIO
Triglycerides: 236 mg/dL — ABNORMAL HIGH (ref <150)
VLDL: 47 mg/dL — ABNORMAL HIGH (ref 0–40)

## 2016-07-20 LAB — LACTIC ACID, PLASMA: LACTIC ACID, VENOUS: 2 mmol/L — AB (ref 0.5–1.9)

## 2016-07-20 LAB — ECHOCARDIOGRAM COMPLETE
HEIGHTINCHES: 65 in
WEIGHTICAEL: 2123.2 [oz_av]

## 2016-07-20 LAB — MRSA PCR SCREENING: MRSA BY PCR: NEGATIVE

## 2016-07-20 MED ORDER — ACETAMINOPHEN 325 MG PO TABS
650.0000 mg | ORAL_TABLET | ORAL | Status: DC | PRN
  Administered 2016-07-22 (×2): 650 mg via ORAL
  Filled 2016-07-20 (×2): qty 2

## 2016-07-20 MED ORDER — HEPARIN SODIUM (PORCINE) 5000 UNIT/ML IJ SOLN
5000.0000 [IU] | Freq: Three times a day (TID) | INTRAMUSCULAR | Status: DC
  Administered 2016-07-20 – 2016-07-23 (×10): 5000 [IU] via SUBCUTANEOUS
  Filled 2016-07-20 (×10): qty 1

## 2016-07-20 MED ORDER — SERTRALINE HCL 100 MG PO TABS
100.0000 mg | ORAL_TABLET | Freq: Every day | ORAL | Status: DC
  Administered 2016-07-20 – 2016-07-22 (×3): 100 mg via ORAL
  Filled 2016-07-20 (×3): qty 1

## 2016-07-20 MED ORDER — ACETAMINOPHEN 160 MG/5ML PO SOLN
650.0000 mg | ORAL | Status: DC | PRN
  Administered 2016-07-20: 650 mg
  Filled 2016-07-20 (×3): qty 20.3

## 2016-07-20 MED ORDER — OSELTAMIVIR PHOSPHATE 30 MG PO CAPS
30.0000 mg | ORAL_CAPSULE | Freq: Every day | ORAL | Status: AC
  Administered 2016-07-20 – 2016-07-23 (×4): 30 mg via ORAL
  Filled 2016-07-20 (×4): qty 1

## 2016-07-20 MED ORDER — STROKE: EARLY STAGES OF RECOVERY BOOK
Freq: Once | Status: AC
  Administered 2016-07-20: 09:00:00

## 2016-07-20 MED ORDER — SENNOSIDES-DOCUSATE SODIUM 8.6-50 MG PO TABS: 1.0000 | ORAL_TABLET | Freq: Every evening | ORAL | Status: DC | PRN

## 2016-07-20 MED ORDER — ALPRAZOLAM 0.25 MG PO TABS
0.2500 mg | ORAL_TABLET | Freq: Every day | ORAL | Status: DC
  Administered 2016-07-20 – 2016-07-21 (×2): 0.25 mg via ORAL
  Filled 2016-07-20 (×5): qty 1

## 2016-07-20 MED ORDER — ENOXAPARIN SODIUM 40 MG/0.4ML ~~LOC~~ SOLN: 40.0000 mg | SUBCUTANEOUS | Status: DC

## 2016-07-20 MED ORDER — ACETAMINOPHEN 650 MG RE SUPP: 650.0000 mg | RECTAL | Status: DC | PRN

## 2016-07-20 MED ORDER — SODIUM CHLORIDE 0.9 % IV SOLN: INTRAVENOUS | Status: DC

## 2016-07-20 MED ORDER — PROCHLORPERAZINE EDISYLATE 5 MG/ML IJ SOLN: 5.0000 mg | INTRAMUSCULAR | Status: DC | PRN

## 2016-07-20 NOTE — Progress Notes (Signed)
Initial Nutrition Assessment  DOCUMENTATION CODES:   Severe malnutrition in context of chronic illness  INTERVENTION:  1. Mighty Shake II TID with meals, each supplement provides 480-500 kcals and 20-23 grams of protein  NUTRITION DIAGNOSIS:   Malnutrition related to chronic illness as evidenced by severe depletion of muscle mass, moderate depletion of body fat, severe depletion of body fat.  GOAL:   Patient will meet greater than or equal to 90% of their needs  MONITOR:   PO intake, I & O's, Labs, Weight trends, Supplement acceptance  REASON FOR ASSESSMENT:   Malnutrition Screening Tool    ASSESSMENT:   Catherine Frazier  is a 83 y.o. female who presents From nursing facility for increased lethargy and suspicion of dehydration and possibly influenza  Spoke with patient's son at bedside. He did not know much history for patient. Reports she had some oral thrush at one point. Patient followed commands and did open her mouth for me to check, did not stick out her tongue. Appears to still have some thrush. Her weight is down a severe 42#/24% over 4 months. Son was unable to recall her PO intake at assisted living facility, but did state she drinks "shakes." Pt exhibits no nausea or vomiting at this time. NPO awaiting speech eval. Nutrition-Focused physical exam completed. Findings are severe fat depletion, moderate-severe muscle depletion, and mild edema.   Appears to be in significant pain.  Labs and medications reviewed: Sodium 170, CL 130, Glucose 155 1/2 NS @ 128mL/hr  Diet Order:  Diet NPO time specified  Skin:  Reviewed, no issues  Last BM:  PTA  Height:   Ht Readings from Last 1 Encounters:  07/20/16 5\' 5"  (1.651 m)    Weight:   Wt Readings from Last 1 Encounters:  07/20/16 132 lb 11.2 oz (60.2 kg)    Ideal Body Weight:  56.81 kg  BMI:  Body mass index is 22.08 kg/m.  Estimated Nutritional Needs:   Kcal:  1256-1450 calories  Protein:  60-72  gm  Fluid:  >/= 1.2L  EDUCATION NEEDS:   No education needs identified at this time  Satira Anis. Elza Varricchio, MS, RD LDN Inpatient Clinical Dietitian Pager [phone]

## 2016-07-20 NOTE — Care Management Important Message (Signed)
Important Message  Patient Details  Name: Feigy Buenger MRN: [id number] Date of Birth: January 30, 1926   Medicare Important Message Given:  Yes  Initial signed IM printed from Epic and given to patient's son.     Katrina Stack, RN 07/20/2016, 1:24 PM

## 2016-07-20 NOTE — Consult Note (Signed)
Referring Physician: Manuella Ghazi    Chief Complaint: Altered mental status  HPI: Catherine Frazier is an 83 y.o. female SN[facility] resident with a history of dementia who is unable to provide any history.  All history obtained from the chart.  For the past 1-2 days prior to admission the patient had decreased mentation and was not eating.  Patient was brought to the ED for further evaluation.  At baseline patient has unintelligible speech but at times will get a word or two out that can be understood.    Date last known well: Unable to determine Time last known well: Unable to determine tPA Given: No: Unable to determine LKW  Past Medical History:  Diagnosis Date  . Alzheimer's dementia   . Anxiety   . Breast cancer ([place])   . Cancer ([place])   . Dementia   . Depression     Past Surgical History:  Procedure Laterality Date  . NO PAST SURGERIES      Family History  Problem Relation Age of Onset  . Family history unknown: Yes   Social History:  reports that she has never smoked. She has never used smokeless tobacco. She reports that she does not drink alcohol or use drugs.  Allergies: No Known Allergies  Medications:  I have reviewed the patient's current medications. Prior to Admission:  Prescriptions Prior to Admission  Medication Sig Dispense Refill Last Dose  . acetaminophen (TYLENOL) 500 MG tablet Take 500 mg by mouth every 4 (four) hours as needed for mild pain, fever or headache.    prn at prn  . ALPRAZolam (XANAX) 0.25 MG tablet Take 0.25 mg by mouth daily. Take 1 tab daily at 1500.   unknown at unknown  . alum & mag hydroxide-simeth (MINTOX) 200-200-20 MG/5ML suspension Take 30 mLs by mouth as needed for indigestion or heartburn. (not to exceed 4 doses in 24 hours)   prn at prn  . clobetasol cream (TEMOVATE) [id number] % Apply 1 application topically 2 (two) times daily as needed (dry, red skin).    prn at prn  . guaifenesin (ROBITUSSIN) 100 MG/5ML syrup Take 200 mg by mouth every 6 (six) hours  as needed for cough. (not to exceed 4 doses in 24 hours)   prn at prn  . loperamide (IMODIUM) 2 MG capsule Take 2 mg by mouth as needed for diarrhea or loose stools. With each loose stool for diarrhea (not to exceed 8 doses in 24 hours)   prn at prn  . magnesium hydroxide (MILK OF MAGNESIA) 400 MG/5ML suspension Take 30 mLs by mouth at bedtime as needed for mild constipation.   prn at prn  . mineral oil-hydrophilic petrolatum (AQUAPHOR) ointment Apply 1 application topically 2 (two) times daily. Apply to legs   unknown at unknown  . Neomycin-Bacitracin-Polymyxin (TRIPLE ANTIBIOTIC) [phone] OINT Apply 1 application topically as needed (skin tears). For minor skin tears or abrasions, clean area with normal saline, apply ointment, cover with bandaid or gauze and tape and change as needed until healed    prn at prn  . nystatin (MYCOSTATIN) 100000 UNIT/ML suspension Take 6 mLs by mouth 4 (four) times daily.   unknown at unknown  . oseltamivir (TAMIFLU) 75 MG capsule Take 75 mg by mouth 2 (two) times daily.   unknown at unknown  . sertraline (ZOLOFT) 100 MG tablet Take 100 mg by mouth at bedtime.   unknown at unknown  . sodium chloride irrigation 0.9 % irrigation Irrigate with 1 application as directed as  needed (skin tears). For minor skin tears or abrasions, clean area with normal saline, apply ointment, cover with bandaid or gauze and tape and change as needed until healed   prn at prn   Scheduled: . ALPRAZolam  0.25 mg Oral Daily  . heparin subcutaneous  5,000 Units Subcutaneous Q8H  . oseltamivir  30 mg Oral Daily  . sertraline  100 mg Oral QHS    ROS: Unable to provide due to mental status  Physical Examination: Blood pressure (!) 151/69, pulse 72, temperature 97.6 F (36.4 C), temperature source Axillary, resp. rate 16, height 5\' 5"  (1.651 m), weight 60.2 kg (132 lb 11.2 oz), SpO2 96 %.  HEENT-  Normocephalic, no lesions, without obvious abnormality.  Normal external eye and  conjunctiva.  Normal TM's bilaterally.  Normal auditory canals and external ears. Normal external nose, mucus membranes and septum.  Normal pharynx. Cardiovascular- S1, S2 normal, pulses palpable throughout   Lungs- chest clear, no wheezing, rales, normal symmetric air entry Abdomen- soft, non-tender; bowel sounds normal; no masses,  no organomegaly Extremities- no edema Lymph-no adenopathy palpable Musculoskeletal-Cries out in pain when extremities touched Skin-warm and dry, no hyperpigmentation, vitiligo, or suspicious lesions  Neurological Examination Mental Status: Alert.  Speech unintelligible.  Does not follow commands.  Patient uncooperative. Cranial Nerves: II: Discs flat bilaterally; Does not blink to bilateral confrontation.  Pupils minimally reactive bilaterally III,IV, VI: ptosis not present, extra-ocular motions intact bilaterally V,VII: decreased right NLF, facial light touch sensation normal bilaterally VIII: hearing unable to be tested  IX,X: gag reflex present XI: bilateral shoulder shrug XII: tongue extension unable to be tested. Motor: Patient moves both upper extremities spontaneously but the left she often lifts above her head and pulls her head down to reach her right.  Right leg flexed.  Does not move either leg.   Sensory: Responds to touch in all extremtiies Deep Tendon Reflexes: Unable to test Plantars: Unable to test due to cooperation Cerebellar: Unable to test due to ability to follow commands Gait: not tested due to safety concerns   Laboratory Studies:  Basic Metabolic Panel:  Recent Labs Lab 07/19/16 2102 07/20/16 1420  NA 170* 163*  K 4.1 4.0  CL >130* >130*  CO2 23 26  GLUCOSE 155* 139*  BUN 95* 104*  CREATININE 2.96* 2.71*  CALCIUM 9.2 8.7*    Liver Function Tests:  Recent Labs Lab 07/19/16 2102  AST 46*  ALT 20  ALKPHOS 82  BILITOT 0.9  PROT 7.0  ALBUMIN 3.1*   No results for input(s): LIPASE, AMYLASE in the last 168  hours. No results for input(s): AMMONIA in the last 168 hours.  CBC:  Recent Labs Lab 07/19/16 2102  WBC 21.9*  NEUTROABS 19.3*  HGB 14.1  HCT 44.0  MCV 88.4  PLT 315    Cardiac Enzymes:  Recent Labs Lab 07/19/16 2102  TROPONINI 0.25*    BNP: Invalid input(s): POCBNP  CBG: No results for input(s): GLUCAP in the last 168 hours.  Microbiology: Results for orders placed or performed during the hospital encounter of 07/19/16  Blood Culture (routine x 2)     Status: None (Preliminary result)   Collection Time: 07/19/16  9:04 PM  Result Value Ref Range Status   Specimen Description BLOOD LEFT FOREARM  Final   Special Requests   Final    BOTTLES DRAWN AEROBIC AND ANAEROBIC  AER 11CC ANA 13CC   Culture NO GROWTH < 12 HOURS  Final   Report Status PENDING  Incomplete  Blood Culture (routine x 2)     Status: None (Preliminary result)   Collection Time: 07/19/16  9:04 PM  Result Value Ref Range Status   Specimen Description BLOOD RIGHT FOREARM  Final   Special Requests   Final    BOTTLES DRAWN AEROBIC AND ANAEROBIC  AER 10CC ANA [place]   Culture NO GROWTH < 12 HOURS  Final   Report Status PENDING  Incomplete  MRSA PCR Screening     Status: None   Collection Time: 07/20/16  2:10 AM  Result Value Ref Range Status   MRSA by PCR NEGATIVE NEGATIVE Final    Comment:        The GeneXpert MRSA Assay (FDA approved for NASAL specimens only), is one component of a comprehensive MRSA colonization surveillance program. It is not intended to diagnose MRSA infection nor to guide or monitor treatment for MRSA infections.     Coagulation Studies: No results for input(s): LABPROT, INR in the last 72 hours.  Urinalysis:  Recent Labs Lab 07/19/16 2102  COLORURINE AMBER*  LABSPEC 1.018  PHURINE 5.0  GLUCOSEU NEGATIVE  HGBUR MODERATE*  BILIRUBINUR NEGATIVE  KETONESUR NEGATIVE  PROTEINUR NEGATIVE  NITRITE NEGATIVE  LEUKOCYTESUR TRACE*    Lipid Panel:    Component Value  Date/Time   CHOL 241 (H) 07/20/2016 0105   TRIG 236 (H) 07/20/2016 0105   HDL 31 (L) 07/20/2016 0105   CHOLHDL 7.8 07/20/2016 0105   VLDL 47 (H) 07/20/2016 0105   LDLCALC 163 (H) 07/20/2016 0105    HgbA1C: No results found for: HGBA1C  Urine Drug Screen:  No results found for: LABOPIA, COCAINSCRNUR, LABBENZ, AMPHETMU, THCU, LABBARB  Alcohol Level: No results for input(s): ETH in the last 168 hours.  Other results: EKG: sinus rhythm at 95 bpm.  Imaging: Ct Abdomen Pelvis Wo Contrast  Result Date: 07/19/2016 CLINICAL DATA:  Possible flu decreased appetite and lethargy EXAM: CT ABDOMEN AND PELVIS WITHOUT CONTRAST TECHNIQUE: Multidetector CT imaging of the abdomen and pelvis was performed following the standard protocol without IV contrast. COMPARISON:  None. FINDINGS: Lower chest: Mild atelectasis at the left lung base. No significant effusion. There is cardiomegaly. Small pericardial effusion. Coronary artery calcifications. Hepatobiliary: No focal hepatic abnormality. Dilated gallbladder up to 4.7 cm. No wall thickening. Slight enlargement of the common bile duct up to 7 mm. Pancreas: Unremarkable. No pancreatic ductal dilatation or surrounding inflammatory changes. Spleen: Normal in size without focal abnormality. Adrenals/Urinary Tract: Right adrenal gland within normal limits. 8 mm left adrenal gland adenoma. Kidneys show no hydronephrosis or focal calcification. The bladder is normal Stomach/Bowel: Stomach nonenlarged. No dilated small bowel. Diffuse colon diverticular disease without acute wall thickening or inflammation. Moderate stool in the colon. Postsurgical changes of the colon. Vascular/Lymphatic: Aortic atherosclerosis. No enlarged abdominal or pelvic lymph nodes. Reproductive: Status post hysterectomy. No adnexal masses. Other: No free air or free fluid. Musculoskeletal: Degenerative changes. No acute or suspicious bone lesions. IMPRESSION: 1. No CT evidence for small bowel  obstruction or colon wall thickening 2. Enlarged gallbladder up to 4.7 cm. Slight enlargement of common bile duct up to 7 mm, suggest correlation with laboratory values 3. Colon diverticular disease without acute inflammation Electronically Signed   By: Donavan Foil M.D.   On: 07/19/2016 23:29   Ct Head Wo Contrast  Result Date: 07/19/2016 CLINICAL DATA:  Lethargy, decreased appetite and diarrhea beginning yesterday. Altered mental status, possible flu. History of dementia, breast cancer. EXAM: CT HEAD WITHOUT CONTRAST TECHNIQUE: Contiguous  axial images were obtained from the base of the skull through the vertex without intravenous contrast. COMPARISON:  CT HEAD September 24, 2015 FINDINGS: BRAIN: Blurring of the LEFT temporal parietal gray-white matter differentiation with local sulcal effacement. Background of severe ventriculomegaly with disproportionate temporal lobe volume loss compatible with neurodegenerative disease. No intraparenchymal hemorrhage, mass effect or midline shift. Old LEFT caudate head lacunar infarct. Stable chronic small vessel ischemic disease. No abnormal extra-axial fluid collections. Basal cisterns are patent. VASCULAR: Mild calcific atherosclerosis of the carotid siphons. SKULL: No skull fracture. No significant scalp soft tissue swelling. SINUSES/ORBITS: The mastoid air-cells and included paranasal sinuses are well-aerated.Soft tissue within the external auditory canals most compatible with cerumen. The included ocular globes and orbital contents are non-suspicious. OTHER: None. IMPRESSION: Acute to subacute moderate LEFT MCA territory nonhemorrhagic infarct. Chronic changes of neurodegenerative disease and, old LEFT caudate head lacunar infarct. Acute findings discussed with and reconfirmed by Dr.DAVID SCHAEVITZ on 07/19/2016 at 9:07 pm. Electronically Signed   By: Elon Alas M.D.   On: 07/19/2016 21:08   Mr Brain Wo Contrast  Result Date: 07/20/2016 CLINICAL DATA:  Stroke  EXAM: MRI HEAD WITHOUT CONTRAST MRA HEAD WITHOUT CONTRAST TECHNIQUE: Multiplanar, multiecho pulse sequences of the brain and surrounding structures were obtained without intravenous contrast. Angiographic images of the head were obtained using MRA technique without contrast. COMPARISON:  Head CT from yesterday FINDINGS: MRI HEAD FINDINGS Brain: Restricted diffusion in the lateral and superior left temporal lobe, reaching the occipital lobe. This is inferior division MCA branch territory. No superimposed acute hemorrhage. There is advanced brain atrophy with severe medial temporal volume loss correlating with patient's history of Alzheimer's disease. Ventriculomegaly is likely ex vacuo. Chronic microvascular disease with confluent ischemic gliosis in the cerebral white matter. Remote lacunar infarct in the left caudate. Vascular: Arterial findings below. Grossly preserved dural venous flow voids. Skull and upper cervical spine: Negative Sinuses/Orbits: Gaze to the right.  Mild sinus mucosal thickening. MRA HEAD FINDINGS Nondiagnostic MRA due to motion artifact. The cavernous carotids, A1, and M1 segments are patent. Faintly visualized vertebral and basilar arteries which are small in the setting of fetal type PCA. IMPRESSION: 1. Large acute, nonhemorrhagic infarct in the inferior division left MCA territory affecting the superior temporal lobe. 2. Motion degraded study resulting in nondiagnostic MRA. 3. Advanced atrophy, especially the medial temporal lobes, correlating with history of Alzheimer's disease. 4. Chronic small vessel disease. Electronically Signed   By: Monte Fantasia M.D.   On: 07/20/2016 11:59   US Carotid Bilateral (at [facility] And [facility] Only)  Result Date: 07/20/2016 CLINICAL DATA:  Stroke EXAM: BILATERAL CAROTID DUPLEX ULTRASOUND TECHNIQUE: Pearline Cables scale imaging, color Doppler and duplex ultrasound were performed of bilateral carotid and vertebral arteries in the neck. COMPARISON:  None. FINDINGS:  Criteria: Quantification of carotid stenosis is based on velocity parameters that correlate the residual internal carotid diameter with NASCET-based stenosis levels, using the diameter of the distal internal carotid lumen as the denominator for stenosis measurement. The following velocity measurements were obtained: RIGHT ICA:  78 cm/sec CCA:  96 cm/sec SYSTOLIC ICA/CCA RATIO:  0.8 DIASTOLIC ICA/CCA RATIO:  1.1 ECA:  96 cm/sec LEFT ICA:  137 cm/sec CCA:  61 cm/sec SYSTOLIC ICA/CCA RATIO:  2.2 DIASTOLIC ICA/CCA RATIO:  1.5 ECA:  99 cm/sec RIGHT CAROTID ARTERY: Mild focal calcified plaque in the bulb. Low resistance internal carotid Doppler pattern. RIGHT VERTEBRAL ARTERY:  Antegrade. LEFT CAROTID ARTERY: Significant calcified plaque in the bulb. Low resistance internal carotid Doppler  pattern. LEFT VERTEBRAL ARTERY:  Antegrade. IMPRESSION: Less than 50% stenosis in the right internal carotid artery 50-69% stenosis in the left internal carotid artery. Electronically Signed   By: Marybelle Killings M.D.   On: 07/20/2016 11:06   Dg Chest Port 1 View  Result Date: 07/19/2016 CLINICAL DATA:  Possible flu decreased appetite and lethargy EXAM: PORTABLE CHEST 1 VIEW COMPARISON:  05/09/2015 FINDINGS: The lungs are hyperinflated. No acute infiltrate or effusion. Heart size slightly enlarged. Atherosclerosis of the aorta. Focal convex opacity at the lower chest could relate to tortuous aorta or possibly a hiatal hernia. IMPRESSION: No acute infiltrate or edema. Convex opacity at the lower chest may reflect tortuous aorta versus hiatal hernia Electronically Signed   By: Donavan Foil M.D.   On: 07/19/2016 20:57   Mr Jodene Nam Head/brain [id number] Cm  Result Date: 07/20/2016 CLINICAL DATA:  Stroke EXAM: MRI HEAD WITHOUT CONTRAST MRA HEAD WITHOUT CONTRAST TECHNIQUE: Multiplanar, multiecho pulse sequences of the brain and surrounding structures were obtained without intravenous contrast. Angiographic images of the head were obtained using MRA  technique without contrast. COMPARISON:  Head CT from yesterday FINDINGS: MRI HEAD FINDINGS Brain: Restricted diffusion in the lateral and superior left temporal lobe, reaching the occipital lobe. This is inferior division MCA branch territory. No superimposed acute hemorrhage. There is advanced brain atrophy with severe medial temporal volume loss correlating with patient's history of Alzheimer's disease. Ventriculomegaly is likely ex vacuo. Chronic microvascular disease with confluent ischemic gliosis in the cerebral white matter. Remote lacunar infarct in the left caudate. Vascular: Arterial findings below. Grossly preserved dural venous flow voids. Skull and upper cervical spine: Negative Sinuses/Orbits: Gaze to the right.  Mild sinus mucosal thickening. MRA HEAD FINDINGS Nondiagnostic MRA due to motion artifact. The cavernous carotids, A1, and M1 segments are patent. Faintly visualized vertebral and basilar arteries which are small in the setting of fetal type PCA. IMPRESSION: 1. Large acute, nonhemorrhagic infarct in the inferior division left MCA territory affecting the superior temporal lobe. 2. Motion degraded study resulting in nondiagnostic MRA. 3. Advanced atrophy, especially the medial temporal lobes, correlating with history of Alzheimer's disease. 4. Chronic small vessel disease. Electronically Signed   By: Monte Fantasia M.D.   On: 07/20/2016 11:59    Assessment: 83 y.o. female presenting with altered mental status.  With dementia at baseline and functional level that was relatively poor. Patient with laboratory signs of dehydration but also MRI of the brain reviewed and shows an acute left inferior division MCA infarct.  Likely secondary to artery to artery embolus from atherosclerotic disease.  Due to patient's poor premorbid function, conservative care recommended.  Carotid dopplers show no evidence of hemodynamically significant stenosis on the right, left 50-69%.  Echocardiogram shows no  cardiac source of emboli with an EF of 55-65%.  A1c pending, LDL 163.  Stroke Risk Factors - hyperlipidemia  Plan: 1. PT consult, OT consult, Speech consult 2. Prophylactic therapy-Antiplatelet med: Aspirin - dose 325mg  daily 3. Telemetry monitoring 4. Statin for lipid management with target LDL<70.    Alexis Goodell, MD Neurology [phone] 07/20/2016, 3:53 PM

## 2016-07-20 NOTE — Progress Notes (Signed)
[facility] NAME: Catherine Frazier    MR#:  [id number]  DATE OF BIRTH:  1927-06-81  SUBJECTIVE:  CHIEF COMPLAINT:   Chief Complaint  Patient presents with  . Influenza  , Alert but confused, has advanced dementia at baseline REVIEW OF SYSTEMS:  Review of Systems  Unable to perform ROS: Dementia   DRUG ALLERGIES:  No Known Allergies VITALS:  Blood pressure (!) 151/69, pulse 72, temperature 97.6 F (36.4 C), temperature source Axillary, resp. rate 16, height 5\' 5"  (1.651 m), weight 60.2 kg (132 lb 11.2 oz), SpO2 96 %. PHYSICAL EXAMINATION:  Physical Exam  Constitutional: Vital signs are normal. She appears malnourished and dehydrated. She appears unhealthy. She appears cachectic. She appears toxic. She has a sickly appearance.  HENT:  Head: Normocephalic and atraumatic.  Eyes: Conjunctivae and EOM are normal. Pupils are equal, round, and reactive to light.  Neck: Normal range of motion. Neck supple. No tracheal deviation present. No thyromegaly present.  Cardiovascular: Normal rate, regular rhythm and normal heart sounds.   Pulmonary/Chest: Effort normal and breath sounds normal. No respiratory distress. She has no wheezes. She exhibits no tenderness.  Abdominal: Soft. Bowel sounds are normal. She exhibits no distension. There is no tenderness.  Musculoskeletal: Normal range of motion.  Neurological: She is alert. She is disoriented. She displays abnormal speech. No cranial nerve deficit.  Skin: Skin is warm and dry. No rash noted.  Psychiatric: Mood and affect normal.  Unable to assess due to her dementia   LABORATORY PANEL:   CBC  Recent Labs Lab 07/19/16 2102  WBC 21.9*  HGB 14.1  HCT 44.0  PLT 315   ------------------------------------------------------------------------------------------------------------------ Chemistries   Recent Labs Lab 07/19/16 2102 07/20/16 1420  NA 170* 163*  K 4.1 4.0  CL >130*  >130*  CO2 23 26  GLUCOSE 155* 139*  BUN 95* 104*  CREATININE 2.96* 2.71*  CALCIUM 9.2 8.7*  AST 46*  --   ALT 20  --   ALKPHOS 82  --   BILITOT 0.9  --    RADIOLOGY:  Ct Abdomen Pelvis Wo Contrast  Result Date: 07/19/2016 CLINICAL DATA:  Possible flu decreased appetite and lethargy EXAM: CT ABDOMEN AND PELVIS WITHOUT CONTRAST TECHNIQUE: Multidetector CT imaging of the abdomen and pelvis was performed following the standard protocol without IV contrast. COMPARISON:  None. FINDINGS: Lower chest: Mild atelectasis at the left lung base. No significant effusion. There is cardiomegaly. Small pericardial effusion. Coronary artery calcifications. Hepatobiliary: No focal hepatic abnormality. Dilated gallbladder up to 4.7 cm. No wall thickening. Slight enlargement of the common bile duct up to 7 mm. Pancreas: Unremarkable. No pancreatic ductal dilatation or surrounding inflammatory changes. Spleen: Normal in size without focal abnormality. Adrenals/Urinary Tract: Right adrenal gland within normal limits. 8 mm left adrenal gland adenoma. Kidneys show no hydronephrosis or focal calcification. The bladder is normal Stomach/Bowel: Stomach nonenlarged. No dilated small bowel. Diffuse colon diverticular disease without acute wall thickening or inflammation. Moderate stool in the colon. Postsurgical changes of the colon. Vascular/Lymphatic: Aortic atherosclerosis. No enlarged abdominal or pelvic lymph nodes. Reproductive: Status post hysterectomy. No adnexal masses. Other: No free air or free fluid. Musculoskeletal: Degenerative changes. No acute or suspicious bone lesions. IMPRESSION: 1. No CT evidence for small bowel obstruction or colon wall thickening 2. Enlarged gallbladder up to 4.7 cm. Slight enlargement of common bile duct up to 7 mm, suggest correlation with laboratory values 3. Colon diverticular disease without  acute inflammation Electronically Signed   By: Donavan Foil M.D.   On: 07/19/2016 23:29   Ct  Head Wo Contrast  Result Date: 07/19/2016 CLINICAL DATA:  Lethargy, decreased appetite and diarrhea beginning yesterday. Altered mental status, possible flu. History of dementia, breast cancer. EXAM: CT HEAD WITHOUT CONTRAST TECHNIQUE: Contiguous axial images were obtained from the base of the skull through the vertex without intravenous contrast. COMPARISON:  CT HEAD September 24, 2015 FINDINGS: BRAIN: Blurring of the LEFT temporal parietal gray-white matter differentiation with local sulcal effacement. Background of severe ventriculomegaly with disproportionate temporal lobe volume loss compatible with neurodegenerative disease. No intraparenchymal hemorrhage, mass effect or midline shift. Old LEFT caudate head lacunar infarct. Stable chronic small vessel ischemic disease. No abnormal extra-axial fluid collections. Basal cisterns are patent. VASCULAR: Mild calcific atherosclerosis of the carotid siphons. SKULL: No skull fracture. No significant scalp soft tissue swelling. SINUSES/ORBITS: The mastoid air-cells and included paranasal sinuses are well-aerated.Soft tissue within the external auditory canals most compatible with cerumen. The included ocular globes and orbital contents are non-suspicious. OTHER: None. IMPRESSION: Acute to subacute moderate LEFT MCA territory nonhemorrhagic infarct. Chronic changes of neurodegenerative disease and, old LEFT caudate head lacunar infarct. Acute findings discussed with and reconfirmed by Dr.DAVID SCHAEVITZ on 07/19/2016 at 9:07 pm. Electronically Signed   By: Elon Alas M.D.   On: 07/19/2016 21:08   Mr Brain Wo Contrast  Result Date: 07/20/2016 CLINICAL DATA:  Stroke EXAM: MRI HEAD WITHOUT CONTRAST MRA HEAD WITHOUT CONTRAST TECHNIQUE: Multiplanar, multiecho pulse sequences of the brain and surrounding structures were obtained without intravenous contrast. Angiographic images of the head were obtained using MRA technique without contrast. COMPARISON:  Head CT from  yesterday FINDINGS: MRI HEAD FINDINGS Brain: Restricted diffusion in the lateral and superior left temporal lobe, reaching the occipital lobe. This is inferior division MCA branch territory. No superimposed acute hemorrhage. There is advanced brain atrophy with severe medial temporal volume loss correlating with patient's history of Alzheimer's disease. Ventriculomegaly is likely ex vacuo. Chronic microvascular disease with confluent ischemic gliosis in the cerebral white matter. Remote lacunar infarct in the left caudate. Vascular: Arterial findings below. Grossly preserved dural venous flow voids. Skull and upper cervical spine: Negative Sinuses/Orbits: Gaze to the right.  Mild sinus mucosal thickening. MRA HEAD FINDINGS Nondiagnostic MRA due to motion artifact. The cavernous carotids, A1, and M1 segments are patent. Faintly visualized vertebral and basilar arteries which are small in the setting of fetal type PCA. IMPRESSION: 1. Large acute, nonhemorrhagic infarct in the inferior division left MCA territory affecting the superior temporal lobe. 2. Motion degraded study resulting in nondiagnostic MRA. 3. Advanced atrophy, especially the medial temporal lobes, correlating with history of Alzheimer's disease. 4. Chronic small vessel disease. Electronically Signed   By: Monte Fantasia M.D.   On: 07/20/2016 11:59   US Carotid Bilateral (at [facility] Only)  Result Date: 07/20/2016 CLINICAL DATA:  Stroke EXAM: BILATERAL CAROTID DUPLEX ULTRASOUND TECHNIQUE: Pearline Cables scale imaging, color Doppler and duplex ultrasound were performed of bilateral carotid and vertebral arteries in the neck. COMPARISON:  None. FINDINGS: Criteria: Quantification of carotid stenosis is based on velocity parameters that correlate the residual internal carotid diameter with NASCET-based stenosis levels, using the diameter of the distal internal carotid lumen as the denominator for stenosis measurement. The following velocity measurements were  obtained: RIGHT ICA:  78 cm/sec CCA:  96 cm/sec SYSTOLIC ICA/CCA RATIO:  0.8 DIASTOLIC ICA/CCA RATIO:  1.1 ECA:  96 cm/sec LEFT ICA:  137  cm/sec CCA:  61 cm/sec SYSTOLIC ICA/CCA RATIO:  2.2 DIASTOLIC ICA/CCA RATIO:  1.5 ECA:  99 cm/sec RIGHT CAROTID ARTERY: Mild focal calcified plaque in the bulb. Low resistance internal carotid Doppler pattern. RIGHT VERTEBRAL ARTERY:  Antegrade. LEFT CAROTID ARTERY: Significant calcified plaque in the bulb. Low resistance internal carotid Doppler pattern. LEFT VERTEBRAL ARTERY:  Antegrade. IMPRESSION: Less than 50% stenosis in the right internal carotid artery 50-69% stenosis in the left internal carotid artery. Electronically Signed   By: Marybelle Killings M.D.   On: 07/20/2016 11:06   Dg Chest Port 1 View  Result Date: 07/19/2016 CLINICAL DATA:  Possible flu decreased appetite and lethargy EXAM: PORTABLE CHEST 1 VIEW COMPARISON:  05/09/2015 FINDINGS: The lungs are hyperinflated. No acute infiltrate or effusion. Heart size slightly enlarged. Atherosclerosis of the aorta. Focal convex opacity at the lower chest could relate to tortuous aorta or possibly a hiatal hernia. IMPRESSION: No acute infiltrate or edema. Convex opacity at the lower chest may reflect tortuous aorta versus hiatal hernia Electronically Signed   By: Donavan Foil M.D.   On: 07/19/2016 20:57   Mr Catherine Frazier Head/brain [id number] Cm  Result Date: 07/20/2016 CLINICAL DATA:  Stroke EXAM: MRI HEAD WITHOUT CONTRAST MRA HEAD WITHOUT CONTRAST TECHNIQUE: Multiplanar, multiecho pulse sequences of the brain and surrounding structures were obtained without intravenous contrast. Angiographic images of the head were obtained using MRA technique without contrast. COMPARISON:  Head CT from yesterday FINDINGS: MRI HEAD FINDINGS Brain: Restricted diffusion in the lateral and superior left temporal lobe, reaching the occipital lobe. This is inferior division MCA branch territory. No superimposed acute hemorrhage. There is advanced brain  atrophy with severe medial temporal volume loss correlating with patient's history of Alzheimer's disease. Ventriculomegaly is likely ex vacuo. Chronic microvascular disease with confluent ischemic gliosis in the cerebral white matter. Remote lacunar infarct in the left caudate. Vascular: Arterial findings below. Grossly preserved dural venous flow voids. Skull and upper cervical spine: Negative Sinuses/Orbits: Gaze to the right.  Mild sinus mucosal thickening. MRA HEAD FINDINGS Nondiagnostic MRA due to motion artifact. The cavernous carotids, A1, and M1 segments are patent. Faintly visualized vertebral and basilar arteries which are small in the setting of fetal type PCA. IMPRESSION: 1. Large acute, nonhemorrhagic infarct in the inferior division left MCA territory affecting the superior temporal lobe. 2. Motion degraded study resulting in nondiagnostic MRA. 3. Advanced atrophy, especially the medial temporal lobes, correlating with history of Alzheimer's disease. 4. Chronic small vessel disease. Electronically Signed   By: Monte Fantasia M.D.   On: 07/20/2016 11:59   ASSESSMENT AND PLAN:  83 year old female admitted for lethargy and new stroke s/s  * Acute Stroke ([place]) - - Appreciate neurology consult. - MRI confirms large acute left inferior division MCA infarct - PT, OT, speech consult - aspirin and statin - Considering her advanced dementia.  She likely has very poor prognosis  * Hypernatremia: Sodium dropped from 170->163 - Improving with half normal saline  * Acute renal failure - Creatinine improving from 2.96->2.7  *  Influenza - continue tamiflu *  Dementia - Baseline is seems that the patient is mostly nonmobile, getting around only some in a wheelchair, mostly nonverbal, and often does not recognize even close loved ones.  I had a long discussion with patient's son who is in agreement with palliative care consultation and consider hospice at her facility.  Patient's husband is already  under hospice services there   Anxiety - home dose anxiolytics   Depression -  continue home meds     All the records are reviewed and case discussed with Care Management/Social Worker. Management plans discussed with the patient, family (son at bedside) and they are in agreement.  CODE STATUS: DO NOT RESUSCITATE,   TOTAL TIME TAKING CARE OF THIS PATIENT: 35 minutes.   More than 50% of the time was spent in counseling/coordination of care: YES  POSSIBLE D/C IN 1-2 DAYS, DEPENDING ON CLINICAL CONDITION.   Max Sane M.D on 07/20/2016 at 4:58 PM  Between 7am to 6pm - Pager - [phone]  After 6pm go to [email or web address] - Proofreader  [facility]  Office  [phone]  CC: Primary care physician; [employer]  Note: This dictation was prepared with Dragon dictation along with smaller phrase technology. Any transcriptional errors that result from this process are unintentional.

## 2016-07-20 NOTE — Evaluation (Signed)
Clinical/Bedside Swallow Evaluation Patient Details  Name: Catherine Frazier MRN: [id number] Date of Birth: [birth year]  Today's Date: 07/20/2016 Time: SLP Start Time (ACUTE ONLY): 1300 SLP Stop Time (ACUTE ONLY): 1400 SLP Time Calculation (min) (ACUTE ONLY): 60 min  Past Medical History:  Past Medical History:  Diagnosis Date  . Alzheimer's dementia   . Anxiety   . Breast cancer ([place])   . Cancer ([place])   . Dementia   . Depression    Past Surgical History:  Past Surgical History:  Procedure Laterality Date  . NO PAST SURGERIES     HPI:  Pt is a 83 y.o. female who presents From nursing facility for increased lethargy and suspicion of dehydration and possibly influenza. On evaluation here the patient was found to have new MCA territory stroke. She is unable to contribute to her history of present illness, collateral information is taken from her family who is at bedside. It seems that her standard baseline cognitive functioning is pretty poor - pt is chair/bed bound and she is mostly nonverbal except for phonations per family report. She resides at [facility]. She has been on a soft foods diet per family - "easily cut up".    Assessment / Plan / Recommendation Clinical Impression  Pt appears to present w/ increased risk for aspiration evidenced by overt s/s of aspiration w/ trials of thin liquids despite following aspiration precautions. Pt was given cup/straw(pinched) sip trials of thin liquid w/ overt coughing noted immediately w/ trials followed by declined respiratory status (increased effort). When pt was given trials of Nectar liquids via cup and straw, no immediate, overt s/s of aspiration were noted. Pt consumed these trials and puree trials w/ no change in status during/post trials. Oral phase appeared grossly wfl for bolus management and clearing of these trial consistencies when given time b/t trials. Pt required feeding support; aspiration precautions. Pt does have a baseline of  severe Dementia and was frequently distracted and required verbal/tactile cues to attend to task of taking po trials. Son reported he has given such cueing at the NH when trying to feed her. Pt may be more distracted than baseline in her Cognitive/Mental status d/t new dx of CVA as well as discomfort of gallbladder(as reported by family). Recommend a Dysphagia level 1 w/ Nectar consistency liquids w/ aspiration precautions; Pills given in Puree. ST services will f/u w/ toleration of diet and trials to upgrade if appropriate; objective swallow assessment as indicated    Aspiration Risk  Mild aspiration risk;Moderate aspiration risk    Diet Recommendation  Dysphagia level 1 diet w/ Nectar consistency liquids; strict aspiration precautions; feeding support at meals. Nutritional supplements as ordered.  Medication Administration: Crushed with puree (as able to crush or in liquid form mixed in a puree)    Other  Recommendations Recommended Consults:  (Dietician consult) Oral Care Recommendations: Oral care BID;Staff/trained caregiver to provide oral care Other Recommendations: Order thickener from pharmacy;Prohibited food (jello, ice cream, thin soups);Remove water pitcher;Have oral suction available   Follow up Recommendations Skilled Nursing facility (TBD)      Frequency and Duration min 3x week  2 weeks       Prognosis Prognosis for Safe Diet Advancement: Fair Barriers to Reach Goals: Cognitive deficits;Severity of deficits      Swallow Study   General Date of Onset: 07/19/16 HPI: Pt is a 83 y.o. female who presents From nursing facility for increased lethargy and suspicion of dehydration and possibly influenza. On evaluation here the patient  was found to have new MCA territory stroke. She is unable to contribute to her history of present illness, collateral information is taken from her family who is at bedside. It seems that her standard baseline cognitive functioning is pretty poor - pt  is chair/bed bound and she is mostly nonverbal except for phonations per family report. She resides at [facility]. She has been on a soft foods diet per family - "easily cut up".  Type of Study: Bedside Swallow Evaluation Previous Swallow Assessment: none per family Diet Prior to this Study: Dysphagia 2 (chopped);Thin liquids Temperature Spikes Noted: No (wbc 21.9) Respiratory Status: Room air History of Recent Intubation: No Behavior/Cognition: Confused;Distractible;Requires cueing;Doesn't follow directions (awake but appeared uncomfortable) Oral Cavity Assessment: Dry;Dried secretions Oral Care Completed by SLP: Yes Oral Cavity - Dentition: Adequate natural dentition;Missing dentition (few) Vision:  (n/a) Self-Feeding Abilities: Total assist Patient Positioning: Upright in bed Baseline Vocal Quality:  (nonverbal but for 2 phonations) Volitional Cough: Cognitively unable to elicit Volitional Swallow: Unable to elicit    Oral/Motor/Sensory Function Overall Oral Motor/Sensory Function:  (appeared grossly wfl w/ bolus management; unable)   Ice Chips Ice chips: Within functional limits Presentation: Spoon (fed; 3 trials ) Other Comments: appeared wfl   Thin Liquid Thin Liquid: Impaired Presentation: Cup;Straw;Self Fed (assisted; 3-4 multiple swallows w/ 3 attempts) Oral Phase Impairments:  (wfl) Oral Phase Functional Implications:  (wfl) Pharyngeal  Phase Impairments: Cough - Immediate;Throat Clearing - Immediate;Cough - Delayed;Suspected delayed Swallow;Multiple swallows (audible swallowing; 3/3 attempts. Pt min impulsive)    Nectar Thick Nectar Thick Liquid: Within functional limits (grossly) Presentation: Cup;Self Fed;Straw (assisted; 2-3 multiple sips x4 attempts; min impulsive) Other Comments: no overt coughing or throat clearing   Honey Thick Honey Thick Liquid: Not tested   Puree Puree: Within functional limits Presentation: Spoon (fed; 8 trials) Other Comments: pt appeared  to c/o abdominal discomfort; distracted and did not appear receptive to more trials   Solid   GO   Solid: Not tested         Orinda Kenner, MS, CCC-SLP Shara Hartis 07/20/2016,2:10 PM

## 2016-07-20 NOTE — Progress Notes (Signed)
Pharmacist - Prescriber Communication  1. Enoxaparin 40 mg subcutaneous once daily has been changed to heparin 5000 units subcutaneous TID due to CrCl <15 mL/min.  2. Oseltamivir has been resumed from outpatient. Influenza A positive in ED. Dose reduced to 30 mg po once daily for CrCl 10 to 30 mL/min.  Thedore Pickel A. [place], [place].D., BCPS Clinical Pharmacist 07/20/2016 [phone]

## 2016-07-20 NOTE — Progress Notes (Signed)
OT Cancellation Note  Patient Details Name: Catherine Frazier MRN: [id number] DOB: 1926-02-28   Cancelled Treatment:    Reason Eval/Treat Not Completed: Medical issues which prohibited therapy. Order received, chart reviewed. Pt is currently contraindicated for therapy. Sodium=170, Chloride=>130, and troponin=0.25. Will continue to monitor and re-attempt OT eval at later date/time as appropriate.  Corky Sox, OTR/L 07/20/2016, 1:05 PM

## 2016-07-20 NOTE — Progress Notes (Signed)
Family Meeting Note  Advance Directive:yes  Today a meeting took place with the Patient and Son.  Patient is unable to participate due [id number] capacity Altered mental Status   The following clinical team members were present during this meeting:MD  The following were discussed:Patient's diagnosis: , Patient's progosis: < 12 months and Goals for treatment: DNR  Additional follow-up to be provided: Palliative Care c/s - may be appropriate for Hospice at her [facility] (her husband is already under Hospice)  Time spent during discussion:20 minutes  Max Sane, MD

## 2016-07-20 NOTE — Progress Notes (Signed)
Gave patient's son an informational CVA packet titled, "Stroke: The Basics". Instructed to read through and ask either RN or MD if have any further questions prior to end of shift. Wenda Low [facility]

## 2016-07-20 NOTE — Care Management (Signed)
Presents from [facility] assisted living with sx concerning for the flu.  She is diagnosed with CVA on head CT.  MRI is pending.  PT SLP and OT and neuro consults pending.Hisotry of alzheimer's and she is a full code

## 2016-07-20 NOTE — Progress Notes (Signed)
PT Hold Note  Patient Details Name: Catherine Frazier MRN: [id number] DOB: April 25, 1926   Cancelled Treatment:    Reason Eval/Treat Not Completed: Medical issues which prohibited therapy;Patient at procedure or test/unavailable. Chart reviewed. Pt is currently contraindicated for therapy. Sodium=170, Chloride=>130, and troponin=0.25. Went to room to speak with family and pt is out for testing. Son states that he is unsure if she can participate with therapy. States she has been declining over the last 6 months and now requires heavy assist from staff at facility for transfers. Remains in wheelchair primarily. Will continue to follow and attempt PT evaluation once she is appropriate/available.   Lyndel Safe Paw Karstens PT, DPT   [name] 07/20/2016, 10:43 AM

## 2016-07-21 ENCOUNTER — Inpatient Hospital Stay: Payer: Medicare Other

## 2016-07-21 LAB — BASIC METABOLIC PANEL
ANION GAP: 6 (ref 5–15)
BUN: 98 mg/dL — ABNORMAL HIGH (ref 6–20)
CALCIUM: 8.3 mg/dL — AB (ref 8.9–10.3)
CO2: 26 mmol/L (ref 22–32)
Chloride: 127 mmol/L — ABNORMAL HIGH (ref 101–111)
Creatinine, Ser: 2.03 mg/dL — ABNORMAL HIGH (ref 0.44–1.00)
GFR, EST AFRICAN AMERICAN: 24 mL/min — AB (ref >60)
GFR, EST NON AFRICAN AMERICAN: 21 mL/min — AB (ref >60)
Glucose, Bld: 114 mg/dL — ABNORMAL HIGH (ref 65–99)
POTASSIUM: 3.6 mmol/L (ref 3.5–5.1)
Sodium: 159 mmol/L — ABNORMAL HIGH (ref 135–145)

## 2016-07-21 LAB — CBC
HEMATOCRIT: 36.2 % (ref 35.0–47.0)
Hemoglobin: 12 g/dL (ref 12.0–16.0)
MCH: 29.2 pg (ref 26.0–34.0)
MCHC: 33 g/dL (ref 32.0–36.0)
MCV: 88.6 fL (ref 80.0–100.0)
Platelets: 213 10*3/uL (ref 150–440)
RBC: 4.09 MIL/uL (ref 3.80–5.20)
RDW: 15.4 % — AB (ref 11.5–14.5)
WBC: 13.5 10*3/uL — AB (ref 3.6–11.0)

## 2016-07-21 LAB — LACTIC ACID, PLASMA
LACTIC ACID, VENOUS: 1.1 mmol/L (ref 0.5–1.9)
Lactic Acid, Venous: 1.1 mmol/L (ref 0.5–1.9)

## 2016-07-21 LAB — URINE CULTURE: CULTURE: NO GROWTH

## 2016-07-21 MED ORDER — DEXTROSE 10 % IV SOLN
INTRAVENOUS | Status: DC
  Administered 2016-07-21: 1000 mL via INTRAVENOUS
  Administered 2016-07-21 – 2016-07-23 (×3): via INTRAVENOUS

## 2016-07-21 MED ORDER — ASPIRIN 325 MG PO TABS
325.0000 mg | ORAL_TABLET | Freq: Every day | ORAL | Status: DC
  Administered 2016-07-21 – 2016-07-23 (×3): 325 mg via ORAL
  Filled 2016-07-21 (×2): qty 1

## 2016-07-21 NOTE — Plan of Care (Signed)
Problem: Education: Goal: Knowledge of [facility] Education information/materials will improve Outcome: Not Progressing Variance: Physical/mental limitations   Problem: Safety: Goal: Ability to remain free from injury will improve Outcome: Not Progressing Variance: Physical/mental limitations    

## 2016-07-21 NOTE — Plan of Care (Signed)
Problem: Health Behavior/Discharge Planning: Goal: Ability to manage health-related needs will improve Outcome: Not Progressing Variance: Physical/mental limitations

## 2016-07-21 NOTE — Progress Notes (Signed)
[facility] NAME: Catherine Frazier    MR#:  [id number]  DATE OF BIRTH:  1926/02/24  SUBJECTIVE:  CHIEF COMPLAINT:   Chief Complaint  Patient presents with  . Influenza  , Alert but confused, has advanced dementia at baseline. nonverbal REVIEW OF SYSTEMS:  Review of Systems  Unable to perform ROS: Dementia   DRUG ALLERGIES:  No Known Allergies VITALS:  Blood pressure 112/60, pulse 62, temperature 97.7 F (36.5 C), temperature source Oral, resp. rate (!) 22, height 5\' 5"  (1.651 m), weight 60.2 kg (132 lb 11.2 oz), SpO2 97 %. PHYSICAL EXAMINATION:  Physical Exam  Constitutional: Vital signs are normal. She appears malnourished and dehydrated. She appears unhealthy. She appears cachectic. She appears toxic. She has a sickly appearance.  HENT:  Head: Normocephalic and atraumatic.  Eyes: Conjunctivae and EOM are normal. Pupils are equal, round, and reactive to light.  Neck: Normal range of motion. Neck supple. No tracheal deviation present. No thyromegaly present.  Cardiovascular: Normal rate, regular rhythm and normal heart sounds.   Pulmonary/Chest: Effort normal and breath sounds normal. No respiratory distress. She has no wheezes. She exhibits no tenderness.  Abdominal: Soft. Bowel sounds are normal. She exhibits no distension. There is no tenderness.  Musculoskeletal: Normal range of motion.  Neurological: She is alert. She is disoriented. She displays abnormal speech. No cranial nerve deficit.  Skin: Skin is warm and dry. No rash noted.  Psychiatric: Mood and affect normal.  Unable to assess due to her dementia   LABORATORY PANEL:   CBC  Recent Labs Lab 07/21/16 0450  WBC 13.5*  HGB 12.0  HCT 36.2  PLT 213   ------------------------------------------------------------------------------------------------------------------ Chemistries   Recent Labs Lab 07/19/16 2102  07/21/16 0450  NA 170*  < > 159*  K 4.1  <  > 3.6  CL >130*  < > 127*  CO2 23  < > 26  GLUCOSE 155*  < > 114*  BUN 95*  < > 98*  CREATININE 2.96*  < > 2.03*  CALCIUM 9.2  < > 8.3*  AST 46*  --   --   ALT 20  --   --   ALKPHOS 82  --   --   BILITOT 0.9  --   --   < > = values in this interval not displayed. RADIOLOGY:  Dg Chest Port 1 View  Result Date: 07/21/2016 CLINICAL DATA:  Increased respiratory rate. EXAM: PORTABLE CHEST 1 VIEW COMPARISON:  07/19/2016 FINDINGS: Unchanged heart size. Tortuous thoracic aorta, unchanged allowing for differences in technique. Minimal right basilar atelectasis. No focal airspace disease to suggest pneumonia. No pulmonary edema or pleural fluid. Skin folds project over the upper left chest. Diffuse bony under mineralization. IMPRESSION: Right lung base atelectasis.  Otherwise unchanged exam. Electronically Signed   By: Jeb Levering M.D.   On: 07/21/2016 05:08   ASSESSMENT AND PLAN:  83 year old female admitted for lethargy and new stroke s/s  * Acute Stroke ([place]) - - Appreciate neurology consult. - MRI confirms large acute left inferior division MCA infarct - PT, OT, speech consult - aspirin and statin - Considering her advanced dementia.  She likely has very poor prognosis - Her overall prognosis with respect to mobility is also very poor.   * Hypernatremia: Sodium dropped from 170->163-> 159 - Improving with half normal saline  * Acute renal failure - Creatinine improving from 2.96->2.0  *  Influenza - continue tamiflu *  Dementia - Baseline is seems that the patient is mostly nonmobile, getting around only some in a wheelchair, mostly nonverbal, and often does not recognize even close loved ones.  I had a long discussion with patient's son who is in agreement with palliative care consultation and consider hospice at her facility.  Patient's husband is already under hospice services there   Anxiety - home dose anxiolytics   Depression - continue home meds     All the records are  reviewed and case discussed with Care Management/Social Worker. Management plans discussed with the patient, family (son at bedside) and they are in agreement.  CODE STATUS: DO NOT RESUSCITATE, Palliative care eval - may benefit from [facility] THIS PATIENT: 35 minutes.   More than 50% of the time was spent in counseling/coordination of care: YES  POSSIBLE D/C IN 1-2 DAYS, DEPENDING ON CLINICAL CONDITION.   Max Sane M.D on 07/21/2016 at 2:13 PM  Between 7am to 6pm - Pager - [phone]  After 6pm go to [email or web address] - Proofreader  [facility]  Office  [phone]  CC: Primary care physician; [employer]  Note: This dictation was prepared with Dragon dictation along with smaller phrase technology. Any transcriptional errors that result from this process are unintentional.

## 2016-07-21 NOTE — Progress Notes (Signed)
Palliative Medicine consult noted. Due to high referral volume, there may be a delay seeing this patient. Please call the Palliative Medicine Team office at [phone] if recommendations are needed in the interim.  Thank you for inviting [place] to see this patient.  Marjie Skiff Alanea Woolridge, RN, BSN, [facility] 07/21/2016 8:33 AM Cell [phone] 8:00-4:00 Monday-Friday Office [phone]

## 2016-07-21 NOTE — Evaluation (Signed)
Physical Therapy Evaluation Patient Details Name: Catherine Frazier MRN: [id number] DOB: 1926/05/23 Today's Date: 07/21/2016   History of Present Illness  Catherine Frazier  is a 83 y.o. female who presents From nursing facility for increased lethargy and suspicion of dehydration and possibly influenza. On evaluation here the patient was found to have new MCA territory stroke. She is unable to contribute to her history of present illness, collateral information is taken from her family who is at bedside. It seems that her standard baseline cognitive functioning is pretty poor. Hospitals were called for admission and further evaluation. Pt unable to contribute to the history however son is able to provide. Pt is nonverbal throughout session  Clinical Impression  Pt admitted with above diagnosis. Pt currently with functional limitations due to the deficits listed below (see PT Problem List).  Pt is nonverbal throughout evaluation and does not follow any commands. She requires total assist for all bed mobility and requires total assist to sit upright due to a complete lack of balance. Pt falls toward the right side. She demonstrates functional weakness and does not appear to move R shoulder but does flex/extend both elbows and wrists. Unable to appreciate any rigidity, spasticity, or clonus in bilateral UE/LE. At baseline pt provides minimal communication and requires heavy assist +2 for transfers to/from wheelchair. Her overall prognosis with respect to mobility is very poor. Will continue to attempt to work with patient. She would benefit from [facility] PT only if she can demonstrate the ability to participate with therapy.     Follow Up Recommendations Home health PT;Other (comment) (At facility) If patient is able to demonstrate any ability to participate during admission.     Equipment Recommendations  None recommended by PT    Recommendations for Other Services       Precautions / Restrictions  Precautions Precautions: Fall Restrictions Weight Bearing Restrictions: No      Mobility  Bed Mobility Overal bed mobility: Needs Assistance Bed Mobility: Supine to Sit;Sit to Supine     Supine to sit: Total assist Sit to supine: Total assist   General bed mobility comments: Pt requires total assist for all phases of bed mobilty. Once upright at EOB requires total assist to remain upright. Pt falls to the R side and is unable to correct  Transfers                 General transfer comment: Unable to attempt transfers or ambulation  Ambulation/Gait                Stairs            Wheelchair Mobility    Modified Rankin (Stroke Patients Only)       Balance Overall balance assessment: Needs assistance Sitting-balance support: Bilateral upper extremity supported;Feet supported Sitting balance-Leahy Scale: Zero Sitting balance - Comments: Pt leans heavily to the R in sitting requiring total assist to remain upright. Unable to self correct. Does not follow commands                                     Pertinent Vitals/Pain Pain Assessment: Faces Faces Pain Scale: Hurts even more Pain Location: Pt groans when moving RLE Pain Intervention(s): Monitored during session    Home Living Family/patient expects to be discharged to:: Assisted living  Prior Function Level of Independence: Needs assistance   Gait / Transfers Assistance Needed: Per son pt does accept some weight through bilateral LE during transfers. Pt requires +2 assist by staff at facility in order to transfer to/from wheelchair. Does not ambulate. Limited command follow at baseline  ADL's / Homemaking Assistance Needed: Full assist required by staff at facility        Hand Dominance   Dominant Hand: Right    Extremity/Trunk Assessment   Upper Extremity Assessment Upper Extremity Assessment: RUE deficits/detail RUE Deficits / Details: Pt  does not follow commands. Flexes and extends at both elbows. Appears to flex both hands. Does not appear to move R shoulder. Moves both legs spontaneously in bed but not on command. No spasticity, rigidity or clonus appreciated            Communication   Communication: Other (comment) (No communitcation provided ruing session)  Cognition Arousal/Alertness: Lethargic Behavior During Therapy: Flat affect Overall Cognitive Status: History of cognitive impairments - at baseline                 General Comments: Pt does not provide any verbal communication. Does not follow any commands. She does look at therapist when her name is called    General Comments      Exercises     Assessment/Plan    PT Assessment Patient needs continued PT services  PT Problem List Decreased strength;Decreased activity tolerance;Decreased balance;Decreased mobility;Decreased coordination;Decreased cognition;Decreased knowledge of use of DME;Decreased safety awareness;Decreased knowledge of precautions          PT Treatment Interventions DME instruction;Functional mobility training;Therapeutic activities;Therapeutic exercise;Balance training;Neuromuscular re-education;Cognitive remediation;Patient/family education;Wheelchair mobility training    PT Goals (Current goals can be found in the Care Plan section)  Acute Rehab PT Goals PT Goal Formulation: Patient unable to participate in goal setting Potential to Achieve Goals: Poor    Frequency 7X/week   Barriers to discharge        Co-evaluation               End of Session   Activity Tolerance: Patient tolerated treatment well Patient left: in bed;with bed alarm set;with call bell/phone within reach;with family/visitor present           Time: 1140-1155 PT Time Calculation (min) (ACUTE ONLY): 15 min   Charges:   PT Evaluation $PT Eval Low Complexity: 1 Procedure     PT G Codes:       Lyndel Safe Huprich PT, DPT    Huprich,Jason 07/21/2016, 1:32 PM

## 2016-07-21 NOTE — Progress Notes (Signed)
Speech Language Pathology Treatment: Dysphagia  Patient Details Name: Catherine Frazier MRN: [id number] DOB: 1925/10/25 Today's Date: 07/21/2016 Time: 1300-1350 SLP Time Calculation (min) (ACUTE ONLY): 50 min  Assessment / Plan / Recommendation Clinical Impression  Pt seen for toleration of current dysphagia diet and for appropriateness to hopefully upgrade her diet consistency. Pt continues to appear quite declined both medically and Cognitively - easily distracted w/ moderate+ confusion and need for cues to follow through w/ basic tasks. She has refused eating more than 2-3 bites at breakfast meal per Son's report, and he attempted to give her thin liquids "to help". Discussion was had w/ Son again during this session re: pt's high risk for aspiration d/t oropharyngeal phase dysphagia apparent during the evaluation(yesterday) as well as the impact of her declined Cognitive status on her swallowing. Pt was positioned upright in bed(tends to lean to her Right baseline) and given small trials of thin liquids via cup, then straw though pt did not know how to suck through the straw. Pt helped to hold the cup which appeared to increase her readiness to take the small sips. No overt s/s of aspiration were noted as she accepted trials - strict aspiration precautions were followed d/t pt's declined Cognitive status. Discussed the need to use small sips slowly w/ pt and monitor for swallowing/oral clearing w/ all po's d/t declined Cognitive awareness; education on other aspiration precautions as well given to Son. Pt unable to grasp instruction d/t baseline Cognitive status. Precautions posted in room.  Pt continues to present w/ increased risk for aspiration d/t Cognitive status. ST will f/u to monitor status, give trials to upgrade diet consistency if able; education. NSG updated.    HPI HPI: Pt is a 83 y.o. female who presents From nursing facility for increased lethargy and suspicion of dehydration and possibly  influenza. On evaluation here the patient was found to have new MCA territory stroke. She is unable to contribute to her history of present illness, collateral information is taken from her family who is at bedside. It seems that her standard baseline cognitive functioning is pretty poor - pt is chair/bed bound and she is mostly nonverbal except for phonations per family report. She resides at [facility]. She has been on a soft foods diet per family - "easily cut up".       SLP Plan  Continue with current plan of care     Recommendations  Diet recommendations: Dysphagia 1 (puree);Nectar-thick liquid Liquids provided via: Cup;Straw (if able to use a straw and w/ no coughing noted) Medication Administration: Crushed with puree Supervision: Staff to assist with self feeding;Full supervision/cueing for compensatory strategies Compensations: Minimize environmental distractions;Small sips/bites;Slow rate;Lingual sweep for clearance of pocketing;Multiple dry swallows after each bite/sip;Follow solids with liquid Postural Changes and/or Swallow Maneuvers: Seated upright 90 degrees;Upright 30-60 min after meal                General recommendations:  (Dietician f/u) Oral Care Recommendations: Oral care BID;Staff/trained caregiver to provide oral care Follow up Recommendations: Skilled Nursing facility (TBD) Plan: Continue with current plan of care       [place], [place], CCC-SLP Frazier,Catherine 07/21/2016, 3:02 PM

## 2016-07-21 NOTE — Progress Notes (Signed)
OT Cancellation Note  Patient Details Name: Catherine Frazier MRN: [id number] DOB: 1926-04-11   Cancelled Treatment:    Reason Eval/Treat Not Completed: Fatigue/lethargy limiting ability to participate. Pt asleep in room upon entry. Difficult to wake. No family present. Per PT eval, pt non verbal, flat affect, unable to communicate. Pt unable to meaningfully participate in OT eval at this time, no family present to assist with history or PLOF. Will continue to monitor and re-attempt evaluation at later date/time as appropriate.  Calli Bashor L Stiller,OTR/L 07/21/2016, 3:56 PM

## 2016-07-22 DIAGNOSIS — Z7189 Other specified counseling: Secondary | ICD-10-CM

## 2016-07-22 DIAGNOSIS — G309 Alzheimer's disease, unspecified: Secondary | ICD-10-CM

## 2016-07-22 DIAGNOSIS — F0280 Dementia in other diseases classified elsewhere without behavioral disturbance: Secondary | ICD-10-CM

## 2016-07-22 DIAGNOSIS — Z515 Encounter for palliative care: Secondary | ICD-10-CM

## 2016-07-22 LAB — HEMOGLOBIN A1C
Hgb A1c MFr Bld: 5.5 % (ref 4.8–5.6)
MEAN PLASMA GLUCOSE: 111 mg/dL

## 2016-07-22 LAB — BASIC METABOLIC PANEL
ANION GAP: 6 (ref 5–15)
BUN: 64 mg/dL — AB (ref 6–20)
CALCIUM: 8.2 mg/dL — AB (ref 8.9–10.3)
CO2: 26 mmol/L (ref 22–32)
Chloride: 123 mmol/L — ABNORMAL HIGH (ref 101–111)
Creatinine, Ser: 1.66 mg/dL — ABNORMAL HIGH (ref 0.44–1.00)
GFR calc Af Amer: 31 mL/min — ABNORMAL LOW (ref >60)
GFR calc non Af Amer: 26 mL/min — ABNORMAL LOW (ref >60)
GLUCOSE: 176 mg/dL — AB (ref 65–99)
Potassium: 3.3 mmol/L — ABNORMAL LOW (ref 3.5–5.1)
Sodium: 155 mmol/L — ABNORMAL HIGH (ref 135–145)

## 2016-07-22 LAB — CBC
HCT: 35.3 % (ref 35.0–47.0)
Hemoglobin: 11.5 g/dL — ABNORMAL LOW (ref 12.0–16.0)
MCH: 29.3 pg (ref 26.0–34.0)
MCHC: 32.6 g/dL (ref 32.0–36.0)
MCV: 89.9 fL (ref 80.0–100.0)
Platelets: 200 10*3/uL (ref 150–440)
RBC: 3.92 MIL/uL (ref 3.80–5.20)
RDW: 15.3 % — AB (ref 11.5–14.5)
WBC: 9.6 10*3/uL (ref 3.6–11.0)

## 2016-07-22 MED ORDER — POTASSIUM CHLORIDE CRYS ER 20 MEQ PO TBCR
40.0000 meq | EXTENDED_RELEASE_TABLET | Freq: Once | ORAL | Status: AC
  Administered 2016-07-22: 40 meq via ORAL
  Filled 2016-07-22: qty 2

## 2016-07-22 NOTE — Progress Notes (Signed)
PT Attempt Note  Patient Details Name: Elaiya Nosal MRN: [id number] DOB: [birth year]   Cancelled Treatment:    Reason Eval/Treat Not Completed: Patient's level of consciousness. Attempted to work with patient. Spent approximately 10 minutes with patient and son. Pt intermittently will appear to respond to her name. She mumbles but does not provide intelligible conversation. Pt will not commands for exercise except for what appears to possible moving of her feet inconsistently. Unclear if this is spontaneous or illicited. Pt currently not appropriate for PT treatment. Will continue to follow but will decrease frequency to 2-6x/wk.  Lyndel Safe Huprich PT, DPT   Huprich,Jason 07/22/2016, 12:18 PM

## 2016-07-22 NOTE — Progress Notes (Signed)
OT Cancellation Note  Patient Details Name: Lorah Kesselman MRN: [id number] DOB: [birth year]   Cancelled Treatment:    Reason Eval/Treat Not Completed: Other (comment). Order received and chart reviewed. Palliative care consult in place and spoke to Joni Reining from [facility] and she indicated a full OT eval is not necessary since pt is not able to follow commands for ADLs and is total care. PT evaluation indicated same status. Family is hoping she can go back to [facility] with hospice in place. Pt not seen for full evaluation and order completed.  Please re-consult if there is a change in status and an OT evaluation is needed.  Chrys Racer, OTR/L ascom [phone] 07/22/16, 12:02 PM

## 2016-07-22 NOTE — Clinical Social Work Note (Signed)
CSW received consult that patient is from [place] unit ALF at [facility] and will need Hospice to follow her.  CSW contacted [facility] and they said they can take patient back today and can have her followed by Hospice.  CSW spoke to patient's son Eriah Cham who would like [facility].  CSW  contacted nurse liason from [facility] who will review patient's information and speak to the family.  CSW to continue to facilitate discharge planning with ALF.  Jones Broom. [place], MSW, [place]  07/22/2016 2:25 PM

## 2016-07-22 NOTE — Care Management (Signed)
Palliative care consult is pending.  There is discussion as to whether patient would be allowed to go back to [facility] with hospice.  She would most likely be total care.  CSW aware.

## 2016-07-22 NOTE — Clinical Social Work Note (Signed)
Clinical Social Work Assessment  Patient Details  Name: Catherine Frazier MRN: [id number] Date of Birth: February 06, 1926  Date of referral:  07/22/16               Reason for consult:  Facility Placement                Permission sought to share information with:  Family Supports Permission granted to share information::  Yes, Verbal Permission Granted  Name::     [place], [place] [phone]   Agency::  [place]  Relationship::     Contact Information:     Housing/Transportation Living arrangements for the past 2 months:  [place] (Memory Care) Source of Information:  Adult Children Patient Interpreter Needed:  None Criminal Activity/Legal Involvement Pertinent to Current Situation/Hospitalization:  No - Comment as needed Significant Relationships:  Adult Children Lives with:  Facility Resident Do you feel safe going back to the place where you live?  Yes Need for family participation in patient care:  Yes (Comment)  Care giving concerns:  Patient from [place] Worker assessment / plan: Patient is a 83 year old married female who is from [place] in the memory care unit.  Patient is alert and oriented x1 and has dementia.  CSW completed assessment by speaking with patient's son.  Patient lives at [facility] with her husband, and patient's health has deteriorated.  Patient's family have agreed to have patient return back to [facility] and to be followed by [facility].  Patient's son was explained the process for Hospice at home, and is agreeable.  Patient's family did not have any other questions or concerns.  Employment status:  Retired Nurse, adult PT Recommendations:  Not assessed at this time Information / Referral to community resources:     Patient/Family's Response to care:  Patient and family are in agreement to having patient return to [facility] memory  care.  Patient/Family's Understanding of and Emotional Response to Diagnosis, Current Treatment, and Prognosis:  Patient's family are aware of current poor prognosis and aware of treatment plan.  Emotional Assessment Appearance:  Appears stated age Attitude/Demeanor/Rapport:    Affect (typically observed):  Appropriate, Calm Orientation:  Oriented to Self Alcohol / Substance use:  Not Applicable Psych involvement (Current and /or in the community):  No (Comment)  Discharge Needs  Concerns to be addressed:  Discharge Planning Concerns Readmission within the last 30 days:  No Current discharge risk:  None Barriers to Discharge:  No Barriers Identified   Ross Ludwig, LCSWA 07/22/2016, 7:05 PM

## 2016-07-22 NOTE — Progress Notes (Signed)
Patient nonverbal. Does not attempt to answer questions or follow commands. Patient is bedbound and needs to be assisted with turning in bed. She is incontinent and completely dependent in all ADL's.

## 2016-07-22 NOTE — Progress Notes (Signed)
New referral for [facility] services at [facility] received form CSW Eric Anterhaus. Catherine Frazier is an 83 year old woman with a known history of dementia admitted to [facility] on 1/16 for evaluation of weakness and possible flu, she was found on CT to have a new left MCA territory infarct. She has continued with poor appetite and congested cough. She has been treated with IV fluids and tamiaflu. Palliative Medicine was consulted and have met with her son Catherine Frazier who wishes for his mother to return to [facility] with hospice services. °Writer met in the room with patient's son Catherine Frazier, patient did not participate in the conversation nor respond to verbal stimuli, she did grimace and call out at times. Staff RN Steven made aware. °Writer initiated education with Catherine Frazier regarding hospice services, philosophy and team approach to care with good understanding voiced. Patient will require a hospital bed to be delivered PRIOR to her discharge. Hospital bed has been ordered and plan is for delivery tomorrow 1/20. Patient will require EMS transport. DNR and MOST form to accompany patient. Catherine Frazier and hospital care team all aware of plan. Patient information faxed to hospice referral. Thank you. °Catherine Robertson RN, BSN, CHPN °[facility], Hospital Liaison °[phone] c °

## 2016-07-22 NOTE — Discharge Summary (Addendum)
[place] [facility] [place] NAME: Catherine Frazier    MR#:  [id number]  DATE OF BIRTH:  1926-05-23  DATE OF ADMISSION:  07/19/2016 ADMITTING PHYSICIAN: Lance Coon, MD  DATE OF DISCHARGE:07/23/2016  PRIMARY CARE PHYSICIAN: [facility]    ADMISSION DIAGNOSIS:  Hypernatremia [E87.0] Altered mental status, unspecified altered mental status type [R41.82] Cerebrovascular accident (CVA), unspecified mechanism ([place]) [I63.9]  DISCHARGE DIAGNOSIS:  Principal Problem:   Stroke [facility]) Active Problems:   Dementia   Anxiety   Depression   Palliative care by specialist   Goals of care, counseling/discussion   Encounter for hospice care discussion   SECONDARY DIAGNOSIS:   Past Medical History:  Diagnosis Date  . Alzheimer's dementia   . Anxiety   . Breast cancer ([place])   . Cancer ([place])   . Dementia   . Depression     HOSPITAL COURSE:   83 year old female admitted for lethargy and new stroke s/s  * Acute Stroke (Catherine Frazier) - - Appreciate neurology consult. - MRI confirms large acute left inferior division MCA infarct - PT, OT, speech consult - aspirin and statin - Considering her advanced dementia.  She likely has very poor prognosis - Her overall prognosis with respect to mobility is also very poor.  - After palliative care consult- we are now arranging d/c with hospice.   Waiting for hospital bed delivered to her facility.  * Hypernatremia: Sodium dropped from 170->163-> 159> 155 - Improving with half normal saline  * Acute renal failure - Creatinine improving from 2.96->2.0> 1.66  *Influenza - continue tamiflu *Dementia - Baseline is seems that the patient is mostly nonmobile, getting around only some in a wheelchair, mostly nonverbal, and often does not recognize even close loved ones.  I had a long discussion with patient's son who is in agreement with palliative care consultation and consider hospice at her  facility.  Patient's husband is already under hospice services there Anxiety - home dose anxiolytics Depression - continue home meds   DISCHARGE CONDITIONS:   Stable.  CONSULTS OBTAINED:  Treatment Team:  Alexis Goodell, MD  DRUG ALLERGIES:  No Known Allergies  DISCHARGE MEDICATIONS:   Current Discharge Medication List    CONTINUE these medications which have NOT CHANGED   Details  acetaminophen (TYLENOL) 500 MG tablet Take 500 mg by mouth every 4 (four) hours as needed for mild pain, fever or headache.     alum & mag hydroxide-simeth (MINTOX) 200-200-20 MG/5ML suspension Take 30 mLs by mouth as needed for indigestion or heartburn. (not to exceed 4 doses in 24 hours)    clobetasol cream (TEMOVATE) [id number] % Apply 1 application topically 2 (two) times daily as needed (dry, red skin).     guaifenesin (ROBITUSSIN) 100 MG/5ML syrup Take 200 mg by mouth every 6 (six) hours as needed for cough. (not to exceed 4 doses in 24 hours)    loperamide (IMODIUM) 2 MG capsule Take 2 mg by mouth as needed for diarrhea or loose stools. With each loose stool for diarrhea (not to exceed 8 doses in 24 hours)    magnesium hydroxide (MILK OF MAGNESIA) 400 MG/5ML suspension Take 30 mLs by mouth at bedtime as needed for mild constipation.    mineral oil-hydrophilic petrolatum (AQUAPHOR) ointment Apply 1 application topically 2 (two) times daily. Apply to legs    Neomycin-Bacitracin-Polymyxin (TRIPLE ANTIBIOTIC) [phone] OINT Apply 1 application topically as needed (skin tears). For minor skin tears or abrasions, clean area with  normal saline, apply ointment, cover with bandaid or gauze and tape and change as needed until healed     nystatin (MYCOSTATIN) 100000 UNIT/ML suspension Take 6 mLs by mouth 4 (four) times daily.      STOP taking these medications     ALPRAZolam (XANAX) 0.25 MG tablet      oseltamivir (TAMIFLU) 75 MG capsule      sertraline (ZOLOFT) 100 MG tablet      sodium  chloride irrigation 0.9 % irrigation          DISCHARGE INSTRUCTIONS:    Hospice to follow on discharge.  If you experience worsening of your admission symptoms, develop shortness of breath, life threatening emergency, suicidal or homicidal thoughts you must seek medical attention immediately by calling 911 or calling your MD immediately  if symptoms less severe.  You Must read complete instructions/literature along with all the possible adverse reactions/side effects for all the Medicines you take and that have been prescribed to you. Take any new Medicines after you have completely understood and accept all the possible adverse reactions/side effects.   Please note  You were cared for by a hospitalist during your hospital stay. If you have any questions about your discharge medications or the care you received while you were in the hospital after you are discharged, you can call the unit and asked to speak with the hospitalist on call if the hospitalist that took care of you is not available. Once you are discharged, your primary care physician will handle any further medical issues. Please note that NO REFILLS for any discharge medications will be authorized once you are discharged, as it is imperative that you return to your primary care physician (or establish a relationship with a primary care physician if you do not have one) for your aftercare needs so that they can reassess your need for medications and monitor your lab values.    Today   CHIEF COMPLAINT:   Chief Complaint  Patient presents with  . Influenza    HISTORY OF PRESENT ILLNESS:  Catherine Frazier  is a 83 y.o. female presents From nursing facility for increased lethargy and suspicion of dehydration and possibly influenza. On evaluation here the patient was found to have new MCA territory stroke. She is unable to contribute to her history of present illness, collateral information is taken from her family who is at  bedside. It seems that her standard baseline cognitive functioning is pretty poor. Hospitals were called for admission and further evaluation   VITAL SIGNS:  Blood pressure (!) 157/99, pulse 65, temperature 98.7 F (37.1 C), temperature source Oral, resp. rate 18, height 5\' 5"  (1.651 m), weight 60.2 kg (132 lb 11.2 oz), SpO2 97 %.  I/O:    Intake/Output Summary (Last 24 hours) at 07/23/16 1320 Last data filed at 07/23/16 0542  Gross per 24 hour  Intake                0 ml  Output                0 ml  Net                0 ml    PHYSICAL EXAMINATION:  Constitutional: Vital signs are normal. She appears malnourished and dehydrated. She appears unhealthy. She appears cachectic. She appears toxic. She has a sickly appearance.  HENT:  Head: Normocephalic and atraumatic.  Eyes: Conjunctivae and EOM are normal. Pupils are equal, round, and reactive to  light.  Neck: Normal range of motion. Neck supple. No tracheal deviation present. No thyromegaly present.  Cardiovascular: Normal rate, regular rhythm and normal heart sounds.   Pulmonary/Chest: Effort normal and breath sounds normal. No respiratory distress. She has no wheezes. She exhibits no tenderness.  Abdominal: Soft. Bowel sounds are normal. She exhibits no distension. There is no tenderness.  Musculoskeletal: Normal range of motion.  Neurological: She is alert. She is disoriented. She displays abnormal speech. No cranial nerve deficit.  Skin: Skin is warm and dry. No rash noted.  Psychiatric: Mood and affect normal.  Unable to assess due to her dementia   DATA REVIEW:   CBC  Recent Labs Lab 07/22/16 0630  WBC 9.6  HGB 11.5*  HCT 35.3  PLT 200    Chemistries   Recent Labs Lab 07/19/16 2102  07/22/16 0630  NA 170*  < > 155*  K 4.1  < > 3.3*  CL >130*  < > 123*  CO2 23  < > 26  GLUCOSE 155*  < > 176*  BUN 95*  < > 64*  CREATININE 2.96*  < > 1.66*  CALCIUM 9.2  < > 8.2*  AST 46*  --   --   ALT 20  --   --    ALKPHOS 82  --   --   BILITOT 0.9  --   --   < > = values in this interval not displayed.  Cardiac Enzymes  Recent Labs Lab 07/19/16 2102  TROPONINI 0.25*    Microbiology Results  Results for orders placed or performed during the hospital encounter of 07/19/16  Urine culture     Status: None   Collection Time: 07/19/16  9:02 PM  Result Value Ref Range Status   Specimen Description URINE, RANDOM  Final   Special Requests NONE  Final   Culture   Final    NO GROWTH Performed at [facility], [address], [place], [place] [address]    Report Status 07/21/2016 FINAL  Final  Blood Culture (routine x 2)     Status: None (Preliminary result)   Collection Time: 07/19/16  9:04 PM  Result Value Ref Range Status   Specimen Description BLOOD LEFT FOREARM  Final   Special Requests   Final    BOTTLES DRAWN AEROBIC AND ANAEROBIC  AER 11CC ANA 13CC   Culture NO GROWTH 4 DAYS  Final   Report Status PENDING  Incomplete  Blood Culture (routine x 2)     Status: None (Preliminary result)   Collection Time: 07/19/16  9:04 PM  Result Value Ref Range Status   Specimen Description BLOOD RIGHT FOREARM  Final   Special Requests   Final    BOTTLES DRAWN AEROBIC AND ANAEROBIC  AER 10CC ANA [place]   Culture NO GROWTH 4 DAYS  Final   Report Status PENDING  Incomplete  MRSA PCR Screening     Status: None   Collection Time: 07/20/16  2:10 AM  Result Value Ref Range Status   MRSA by PCR NEGATIVE NEGATIVE Final    Comment:        The GeneXpert MRSA Assay (FDA approved for NASAL specimens only), is one component of a comprehensive MRSA colonization surveillance program. It is not intended to diagnose MRSA infection nor to guide or monitor treatment for MRSA infections.     RADIOLOGY:  No results found.  EKG:   Orders placed or performed during the hospital encounter of 07/19/16  .  EKG 12-Lead  . EKG 12-Lead      Management plans discussed with the patient, family and they are  in agreement.  CODE STATUS:     Code Status Orders        Start     Ordered   07/20/16 1230  Do not attempt resuscitation (DNR)  Continuous    Question Answer Comment  In the event of cardiac or respiratory ARREST Do not call a "code blue"   In the event of cardiac or respiratory ARREST Do not perform Intubation, CPR, defibrillation or ACLS   In the event of cardiac or respiratory ARREST Use medication by any route, position, wound care, and other measures to relive pain and suffering. May use oxygen, suction and manual treatment of airway obstruction as needed for comfort.      07/20/16 1229    Code Status History    Date Active Date Inactive Code Status Order ID Comments User Context   07/20/2016 12:27 AM 07/20/2016 12:29 PM Full Code [id number]  Lance Coon, MD Inpatient      TOTAL TIME TAKING CARE OF THIS PATIENT: 35 minutes.    Vaughan Basta M.D on 07/23/2016 at 1:20 PM  Between 7am to 6pm - Pager - [phone]  After 6pm go to [email or web address] - password [email or web address] [facility]  Office  [phone]  CC: Primary care physician; [facility]   Note: This dictation was prepared with Dragon dictation along with smaller phrase technology. Any transcriptional errors that result from this process are unintentional.

## 2016-07-22 NOTE — Consult Note (Signed)
Consultation Note Date: 07/22/2016   Patient Name: Catherine Frazier  DOB: 1925/08/24  MRN: [id number]  Age / Sex: 83 y.o., female  PCP: [facility] Referring Physician: Vaughan Basta, MD  Reason for Consultation: Establishing goals of care and Hospice Evaluation  HPI/Patient Profile: 83 y.o. female  with past medical history of Alzheimer's, depression, anxiety, and breast cancer admitted on 07/19/2016 with lethargy and possible influenza. Baseline with advanced dementia. MRI confirmed large acute MCA infarct. PT/OT/Speech consults placed. Carotid left stenosis of 50-69%. Positive for influenza-receiving tamiflu. Also with acute kidney injury and hypernatremia secondary to dehydration and alzheimer's. Palliative medicine consultation for goals of care.   Clinical Assessment and Goals of Care: I have reviewed medical records, discussed with Dr. Anselm Frazier, and met with son Catherine Frazier) at bedside to discuss diagnosis, prognosis, GOC, EOL wishes, and disposition options. Introduced Palliative Medicine as specialized medical care for people living with serious illness. It focuses on providing relief from the symptoms and stress of a serious illness. The goal is to improve quality of life for both the patient and the family.  Discussed current hospitalization and chronic co-morbidities that are contributing to decline. Patient with poor baseline prior to hospitalization. Bed bound, total care, and minimal speech. Discussed natural trajectory of dementia and this may be her new baseline with acute stroke along with dementia. Also with disease progression, common for her to have decreased oral intake and high risk for continued dehydration and aspiration.   Discussed the difference between aggressive medical intervention and comfort care pathway. He understands she has declined and appropriate for hospice.  Discussed hospice services at facility and re-hospitalization only if symptoms cannot be managed there. Catherine Frazier agrees with hospice and comfort measures.   Advanced directives, concepts specific to code status, and artifical feeding and hydration were discussed. MOST form completed and placed in chart.   Questions and concerns were addressed. I provided Catherine Frazier a copy of [facility] and my contact information. He is appreciative of the consult.   SUMMARY OF RECOMMENDATIONS    DNR/DNI.  Completed durable DNR and MOST form.   Son agreeable with hospice services at [facility].   Discussed with Dr. Anselm Frazier. Possible discharge today.   PMT will continue to shadow chart and f/u with patient and family if necessary.   Code Status/Advance Care Planning:  DNR   Symptom Management:   Per attending  Palliative Prophylaxis:   Aspiration, Bowel Regimen, Delirium Protocol, Oral Care and Turn Reposition  Additional Recommendations (Limitations, Scope, Preferences):  Full Comfort Care  Psycho-social/Spiritual:   Desire for further Chaplaincy support:yes  Additional Recommendations: Caregiving  Support/Resources and Education on Hospice  Prognosis:   < 3 months if not less with new stroke with underlying advanced dementia and severe functional and nutritional status decline.  Discharge Planning: [place] with Hospice      Primary Diagnoses: Present on Admission: . Stroke (Catherine Frazier) . Dementia . Anxiety . Depression   I have reviewed the medical record, interviewed the patient and family, and examined  the patient. The following aspects are pertinent.  Past Medical History:  Diagnosis Date  . Alzheimer's dementia   . Anxiety   . Breast cancer (Catherine Frazier)   . Cancer (Catherine Frazier)   . Dementia   . Depression    Social History   Social History  . Marital status: Married    Spouse name: N/A  . Number of children: N/A  . Years of education: N/A   Social History Main Topics  .  Smoking status: Never Smoker  . Smokeless tobacco: Never Used  . Alcohol use No  . Drug use: No  . Sexual activity: Not Asked   Other Topics Concern  . None   Social History Narrative  . None   Family History  Problem Relation Age of Onset  . Family history unknown: Yes   Scheduled Meds: . ALPRAZolam  0.25 mg Oral Daily  . aspirin  325 mg Oral Daily  . heparin subcutaneous  5,000 Units Subcutaneous Q8H  . oseltamivir  30 mg Oral Daily  . sertraline  100 mg Oral QHS   Continuous Infusions: . dextrose 75 mL/hr at 07/22/16 0336   PRN Meds:.acetaminophen **OR** acetaminophen (TYLENOL) oral liquid 160 mg/5 mL **OR** acetaminophen, prochlorperazine, senna-docusate Medications Prior to Admission:  Prior to Admission medications   Medication Sig Start Date End Date Taking? Authorizing Provider  acetaminophen (TYLENOL) 500 MG tablet Take 500 mg by mouth every 4 (four) hours as needed for mild pain, fever or headache.    Yes Historical Provider, MD  ALPRAZolam (XANAX) 0.25 MG tablet Take 0.25 mg by mouth daily. Take 1 tab daily at 1500.   Yes Historical Provider, MD  alum & mag hydroxide-simeth (MINTOX) 200-200-20 MG/5ML suspension Take 30 mLs by mouth as needed for indigestion or heartburn. (not to exceed 4 doses in 24 hours)   Yes Historical Provider, MD  clobetasol cream (TEMOVATE) 1.74 % Apply 1 application topically 2 (two) times daily as needed (dry, red skin).    Yes Historical Provider, MD  guaifenesin (ROBITUSSIN) 100 MG/5ML syrup Take 200 mg by mouth every 6 (six) hours as needed for cough. (not to exceed 4 doses in 24 hours)   Yes Historical Provider, MD  loperamide (IMODIUM) 2 MG capsule Take 2 mg by mouth as needed for diarrhea or loose stools. With each loose stool for diarrhea (not to exceed 8 doses in 24 hours)   Yes Historical Provider, MD  magnesium hydroxide (MILK OF MAGNESIA) 400 MG/5ML suspension Take 30 mLs by mouth at bedtime as needed for mild constipation.   Yes  Historical Provider, MD  mineral oil-hydrophilic petrolatum (AQUAPHOR) ointment Apply 1 application topically 2 (two) times daily. Apply to legs   Yes Historical Provider, MD  Neomycin-Bacitracin-Polymyxin (TRIPLE ANTIBIOTIC) [phone] OINT Apply 1 application topically as needed (skin tears). For minor skin tears or abrasions, clean area with normal saline, apply ointment, cover with bandaid or gauze and tape and change as needed until healed    Yes Historical Provider, MD  nystatin (MYCOSTATIN) 100000 UNIT/ML suspension Take 6 mLs by mouth 4 (four) times daily.   Yes Historical Provider, MD  oseltamivir (TAMIFLU) 75 MG capsule Take 75 mg by mouth 2 (two) times daily. 07/19/16 07/23/16 Yes Historical Provider, MD  sertraline (ZOLOFT) 100 MG tablet Take 100 mg by mouth at bedtime.   Yes Historical Provider, MD  sodium chloride irrigation 0.9 % irrigation Irrigate with 1 application as directed as needed (skin tears). For minor skin tears or abrasions, clean area  with normal saline, apply ointment, cover with bandaid or gauze and tape and change as needed until healed   Yes Historical Provider, MD   No Known Allergies Review of Systems  Unable to perform ROS: Dementia   Physical Exam  Constitutional: She is easily aroused. She appears ill.  Opens eyes to voice but does not communicate or follow commands  HENT:  Head: Normocephalic and atraumatic.  Cardiovascular: Regular rhythm and normal heart sounds.   Pulmonary/Chest: Effort normal. She has decreased breath sounds.  Abdominal: Normal appearance.  Neurological: She is easily aroused. She is disoriented.  Skin: Skin is warm and dry. Ecchymosis noted. There is pallor.  Psychiatric: Cognition and memory are impaired. She is inattentive.  Nursing note and vitals reviewed.  Vital Signs: BP (!) 141/57 (BP Location: Left Arm)   Pulse 60   Temp 97.5 F (36.4 C) (Oral)   Resp 12   Ht _0  (1.651 m)   Wt 60.2 kg (132 lb 11.2 oz)   SpO2 99%    BMI 22.08 kg/m  Pain Assessment: PAINAD   Pain Score: 5   SpO2: SpO2: 99 % O2 Device:SpO2: 99 % O2 Flow Rate: .O2 Flow Rate (L/min): 0 L/min  IO: Intake/output summary:   Intake/Output Summary (Last 24 hours) at 07/22/16 1657 Last data filed at 07/22/16 1660  Gross per 24 hour  Intake          1921.25 ml  Output                0 ml  Net          1921.25 ml    LBM: Last BM Date: 07/18/16 Baseline Weight: Weight: 58.9 kg (129 lb 12.8 oz) Most recent weight: Weight: 60.2 kg (132 lb 11.2 oz)     Palliative Assessment/Data: PPS 30%   Flowsheet Rows   Flowsheet Row Most Recent Value  Intake Tab  Referral Department  Hospitalist  Unit at Time of Referral  Cardiac/Telemetry Unit  Palliative Care Primary Diagnosis  Neurology  Date Notified  07/20/16  Palliative Care Type  New Palliative care  Reason for referral  Clarify Goals of Care  Date of Admission  07/19/16  Date first seen by Palliative Care  07/22/16  # of days Palliative referral response time  2 Day(s)  # of days IP prior to Palliative referral  1  Clinical Assessment  Palliative Performance Scale Score  30%  Psychosocial & Spiritual Assessment  Palliative Care Outcomes  Patient/Family meeting held?  Yes  Who was at the meeting?  patient and son Catherine Frazier)  Palliative Care Outcomes  Clarified goals of care, Counseled regarding hospice, Provided end of life care assistance, Completed durable DNR, Transitioned to hospice, Changed to focus on comfort, Provided advance care planning, Provided psychosocial or spiritual support  Actual Discharge Date  07/22/16 [facility] with hospice]      Time In: 1050 Time Out: 1200 Time Total: 74mn Greater than 50%  of this time was spent counseling and coordinating care related to the above assessment and plan.  Signed by:  MIhor Dow FNP-C Palliative Medicine Team  Phone: [phone]: [phone]  Please contact Palliative Medicine Team phone at [phone]  questions and concerns.  For individual provider: See AShea Evans

## 2016-07-22 NOTE — NC FL2 (Signed)
[place] LEVEL OF CARE SCREENING TOOL     IDENTIFICATION  Patient Name: Catherine Frazier Birthdate: [birth year] Sex: female Admission Date (Current Location): 07/19/2016  [facility] and [place] Number:  Engineering geologist and Address:  [facility], [address], [place], [place] [address]      Provider Number: [id number]  Attending Physician Name and Address:  Vaughan Basta, MD  Relative Name and Phone Number:  [place], [place] [phone]     Current Level of Care: Hospital Recommended Level of Care: [place] (Memory Care unit) Prior Approval Number:    Date Approved/Denied:   PASRR Number:    Discharge Plan: Domiciliary (Rest home) ([facility])    Current Diagnoses: Patient Active Problem List   Diagnosis Date Noted  . Stroke ([place]) 07/19/2016  . Dementia 07/19/2016  . Anxiety 07/19/2016  . Depression 07/19/2016    Orientation RESPIRATION BLADDER Height & Weight        Normal Incontinent Weight: 132 lb 11.2 oz (60.2 kg) Height:  5\' 5"  (165.1 cm)  BEHAVIORAL SYMPTOMS/MOOD NEUROLOGICAL BOWEL NUTRITION STATUS      Incontinent Diet (Dysphagia 1)  AMBULATORY STATUS COMMUNICATION OF NEEDS Skin   Total Care Verbally Normal                       Personal Care Assistance Level of Assistance  Total care       Total Care Assistance: Maximum assistance   Functional Limitations Info  Sight, Hearing, Speech Sight Info: Adequate Hearing Info: Adequate Speech Info: Adequate    SPECIAL CARE FACTORS FREQUENCY  PT (By licensed PT)     PT Frequency: Home Health PT 2x a week              Contractures Contractures Info: Not present    Additional Factors Info  Code Status, Psychotropic Code Status Info: DNR   Psychotropic Info: ALPRAZolam (XANAX) tablet 0.25 mg or sertraline (ZOLOFT) tablet 100 mg         Current Medications (07/22/2016):  This is the  current hospital active medication list Current Facility-Administered Medications  Medication Dose Route Frequency Provider Last Rate Last Dose  . acetaminophen (TYLENOL) tablet 650 mg  650 mg Oral Q4H PRN Lance Coon, MD       Or  . acetaminophen (TYLENOL) solution 650 mg  650 mg Per Tube Q4H PRN Lance Coon, MD   650 mg at 07/20/16 1433   Or  . acetaminophen (TYLENOL) suppository 650 mg  650 mg Rectal Q4H PRN Lance Coon, MD      . ALPRAZolam Duanne Moron) tablet 0.25 mg  0.25 mg Oral Daily Lance Coon, MD   0.25 mg at 07/21/16 2106  . aspirin tablet 325 mg  325 mg Oral Daily Max Sane, MD   325 mg at 07/22/16 0943  . dextrose 10 % infusion   Intravenous Continuous Lenard Simmer, MD 75 mL/hr at 07/22/16 0336    . heparin injection 5,000 Units  5,000 Units Subcutaneous Q8H Lance Coon, MD   5,000 Units at 07/22/16 [phone]  . oseltamivir (TAMIFLU) capsule 30 mg  30 mg Oral Daily Lance Coon, MD   30 mg at 07/22/16 0943  . prochlorperazine (COMPAZINE) injection 5 mg  5 mg Intravenous Q4H PRN Lance Coon, MD      . senna-docusate (Senokot-S) tablet 1 tablet  1 tablet Oral QHS PRN Lance Coon, MD      .  sertraline (ZOLOFT) tablet 100 mg  100 mg Oral QHS Lance Coon, MD   100 mg at 07/21/16 2106     Discharge Medications: Please see discharge summary for a list of discharge medications.  Relevant Imaging Results:  Relevant Lab Results:   Additional Information SSN [id number]  Ross Ludwig, [place]

## 2016-07-22 NOTE — Care Management (Signed)
patient to return to [facility] under a hospice plan of care through [facility].  Will require hospital bed and alarms.  Informed karen R - liaison and CSW. Hospital bed will be delivered 1.20 and needs to be in place before patient can discharge.

## 2016-07-22 NOTE — Plan of Care (Signed)
Problem: SLP Dysphagia Goals Goal: Misc Dysphagia Goal Pt will safely tolerate po diet of least restrictive consistency w/ no overt s/s of aspiration noted by Staff/pt/family x3 sessions.    

## 2016-07-23 NOTE — Clinical Social Work Note (Addendum)
CSW spoke with [facility] med tech who reported that the hospice bed has not been delivered as of yet. The patient cannot dc until the bed is at the facility. CSW will con't to follow.  At 3:11 pm, [facility] called to report that bed was ready. CSW notified RNCM for the hospice at home service to be initiated.  Santiago Bumpers, MSW, Latanya Presser [phone]

## 2016-07-23 NOTE — Clinical Social Work Note (Signed)
Patient to dc to [facility] via non-emergent EMS to be followed by [facility]. Patient's family is aware and in agreement as is the facility. Packet delivered to chart and RN Remo Lipps aware. CSW will con't to follow pending additional dc needs.   Santiago Bumpers, MSW, Latanya Presser [phone]

## 2016-07-23 NOTE — Care Management Note (Signed)
Case Management Note  Patient Details  Name: Catherine Frazier MRN: [id number] Date of Birth: 09-Sep-1925  Subjective/Objective:   Son Hindy Wahlen is aware of Mrs Slight discharge back to [facility] with [facility] to follow. A hospital bed is reportedly in place at [facility]. Remo Lipps RN is calling EMS to transport. Discharge information was faxed to [facility] at [phone] per request of Jonne Ply at [facility].                  Action/Plan:   Expected Discharge Date:  07/23/16               Expected Discharge Plan:     In-House Referral:     Discharge planning Services     Post Acute Care Choice:    Choice offered to:     DME Arranged:    DME Agency:     HH Arranged:    HH Agency:     Status of Service:     If discussed at [employer] [facility] [employer], dates discussed:    Additional Comments:  Treyvon Blahut A, RN 07/23/2016, 4:31 PM

## 2016-07-23 NOTE — Discharge Instructions (Signed)
Hospice follow up at SNF.

## 2016-07-23 NOTE — Progress Notes (Signed)
[place] [facility] [place] NAME: Catherine Frazier    MR#:  [id number]  DATE OF BIRTH:  09/23/1925  SUBJECTIVE:  CHIEF COMPLAINT:   Chief Complaint  Patient presents with  . Influenza  , Alert but confused, has advanced dementia at baseline. Nonverbal, not much changed in last 1-2 days.  son in room. REVIEW OF SYSTEMS:  Review of Systems  Unable to perform ROS: Dementia   DRUG ALLERGIES:  No Known Allergies VITALS:  Blood pressure (!) 157/99, pulse 65, temperature 98.7 F (37.1 C), temperature source Oral, resp. rate 18, height 5\' 5"  (1.651 m), weight 60.2 kg (132 lb 11.2 oz), SpO2 97 %. PHYSICAL EXAMINATION:  Physical Exam  Constitutional: Vital signs are normal. She appears malnourished and dehydrated. She appears unhealthy. She appears cachectic. She appears toxic. She has a sickly appearance.  HENT:  Head: Normocephalic and atraumatic.  Eyes: Conjunctivae and EOM are normal. Pupils are equal, round, and reactive to light.  Neck: Normal range of motion. Neck supple. No tracheal deviation present. No thyromegaly present.  Cardiovascular: Normal rate, regular rhythm and normal heart sounds.   Pulmonary/Chest: Effort normal and breath sounds normal. No respiratory distress. She has no wheezes. She exhibits no tenderness.  Abdominal: Soft. Bowel sounds are normal. She exhibits no distension. There is no tenderness.  Musculoskeletal: Normal range of motion.  Neurological: She is alert. She is disoriented. She displays abnormal speech. No cranial nerve deficit.  Skin: Skin is warm and dry. No rash noted.  Psychiatric: Mood and affect normal.  Unable to assess due to her dementia   LABORATORY PANEL:   CBC  Recent Labs Lab 07/22/16 0630  WBC 9.6  HGB 11.5*  HCT 35.3  PLT 200   ------------------------------------------------------------------------------------------------------------------ Chemistries   Recent Labs Lab  07/19/16 2102  07/22/16 0630  NA 170*  < > 155*  K 4.1  < > 3.3*  CL >130*  < > 123*  CO2 23  < > 26  GLUCOSE 155*  < > 176*  BUN 95*  < > 64*  CREATININE 2.96*  < > 1.66*  CALCIUM 9.2  < > 8.2*  AST 46*  --   --   ALT 20  --   --   ALKPHOS 82  --   --   BILITOT 0.9  --   --   < > = values in this interval not displayed. RADIOLOGY:  No results found. ASSESSMENT AND PLAN:  83 year old female admitted for lethargy and new stroke s/s  * Acute Stroke ([place]) - - Appreciate neurology consult. - MRI confirms large acute left inferior division MCA infarct - PT, OT, speech consult - aspirin and statin - Considering her advanced dementia.  She likely has very poor prognosis - Her overall prognosis with respect to mobility is also very poor.  - Son agreed with hospice care at [facility]- plan for d/c but could not get hospital bed delivered yet, so will wait until then.  * Hypernatremia: Sodium dropped from 170->163-> 159 - Improving with half normal saline  * Acute renal failure - Creatinine improving from 2.96->2.0  *  Influenza - continue tamiflu *  Dementia - Baseline is seems that the patient is mostly nonmobile, getting around only some in a wheelchair, mostly nonverbal, and often does not recognize even close loved ones.  I had a long discussion with patient's son who is in agreement with palliative care consultation and consider hospice at her  facility.  Patient's husband is already under hospice services there   Anxiety - home dose anxiolytics   Depression - continue home meds     All the records are reviewed and case discussed with Care Management/Social Worker. Management plans discussed with the patient, family (son at bedside) and they are in agreement.  CODE STATUS: DO NOT RESUSCITATE, Palliative care eval - may benefit from [facility] THIS PATIENT: 35 minutes.   More than 50% of the time was spent in counseling/coordination of care:  YES  POSSIBLE D/C IN 1-2 DAYS, DEPENDING ON CLINICAL CONDITION. Waiting for hospital bed delivered there at SNF.  Vaughan Basta M.D on 07/23/2016 at 1:16 PM  Between 7am to 6pm - Pager - [phone]  After 6pm go to [email or web address] - Proofreader  [facility]  Hospitalists  Office  [phone]  CC: Primary care physician; [employer]  Note: This dictation was prepared with Dragon dictation along with smaller phrase technology. Any transcriptional errors that result from this process are unintentional.

## 2016-07-24 LAB — CULTURE, BLOOD (ROUTINE X 2)
CULTURE: NO GROWTH
Culture: NO GROWTH

## 2016-09-01 DEATH — deceased

## 2017-03-29 IMAGING — CT CT HEAD W/O CM
3 series · 15 of 47 positions shown, 18 images · non-contrast
Comparison: CT HEAD September 24, 2015

CLINICAL DATA: Lethargy, decreased appetite and diarrhea beginning
yesterday. Altered mental status, possible flu. History of dementia,
breast cancer.

EXAM:
CT HEAD WITHOUT CONTRAST
TECHNIQUE: Contiguous axial images were obtained from the base of the skull
through the vertex without intravenous contrast.

[Series 2: head wo · axial · 0.40mm/px · z∈[-93,+37]mm · 9 of 32 slices shown, 12 images]
[im 3/32  brain]
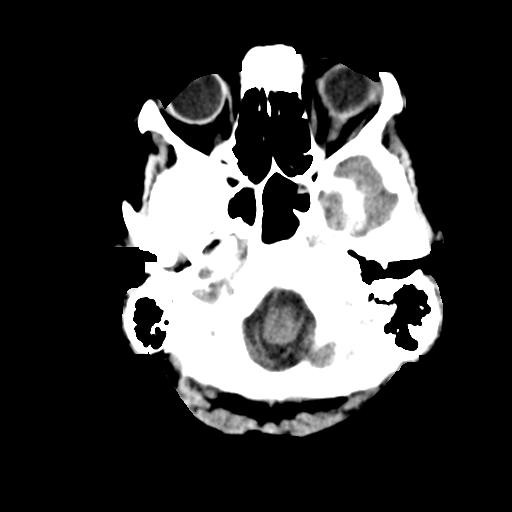
[im 3/32  bone]
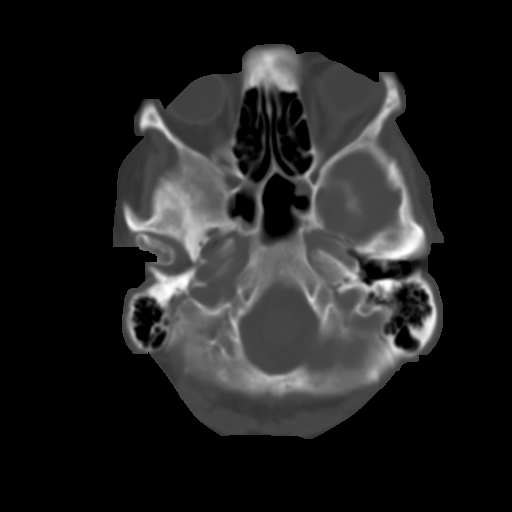
[im 6/32  brain]
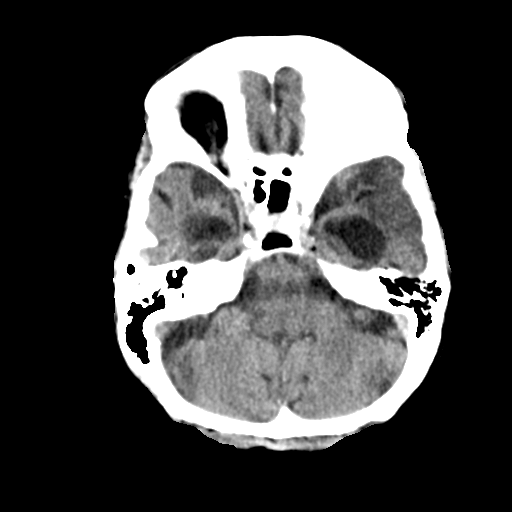
[im 9/32  brain]
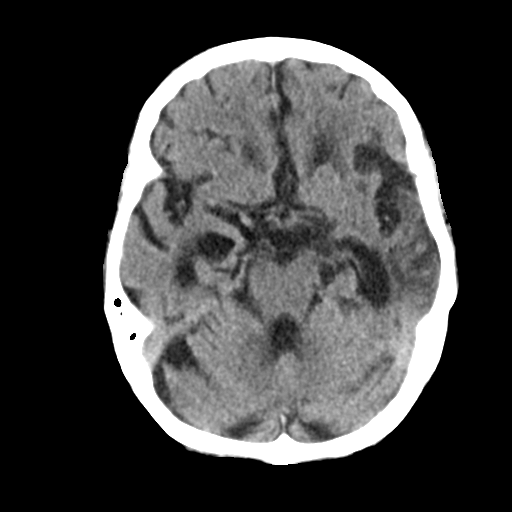
[im 12/32  brain]
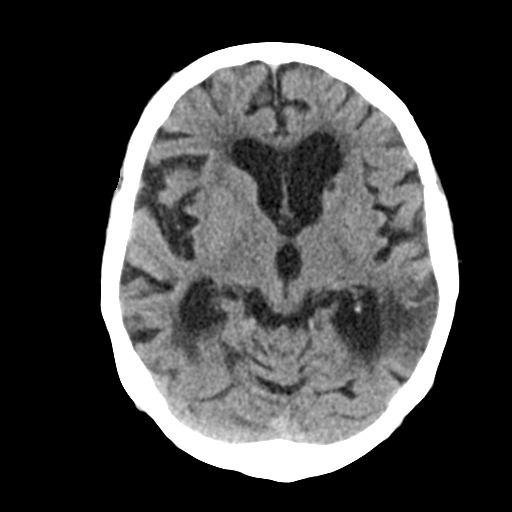
[im 17/32  brain]
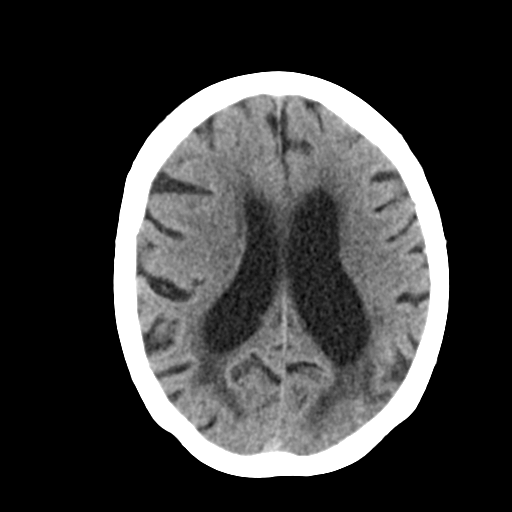
[im 17/32  bone]
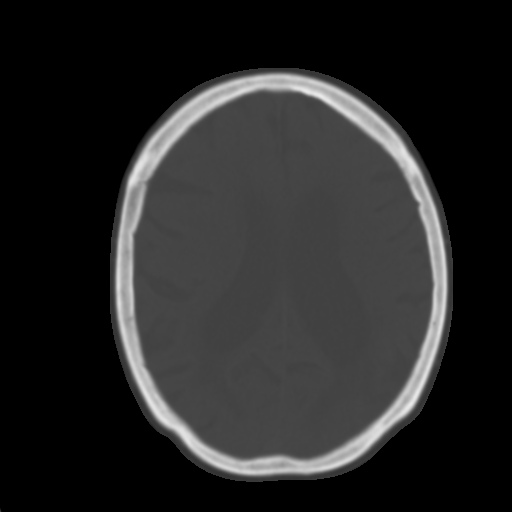
[im 20/32  brain]
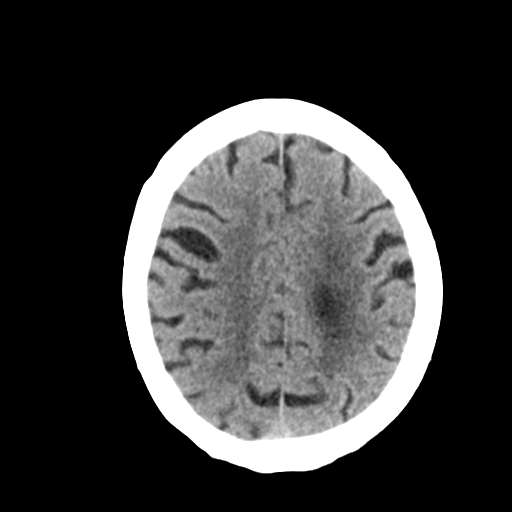
[im 23/32  brain]
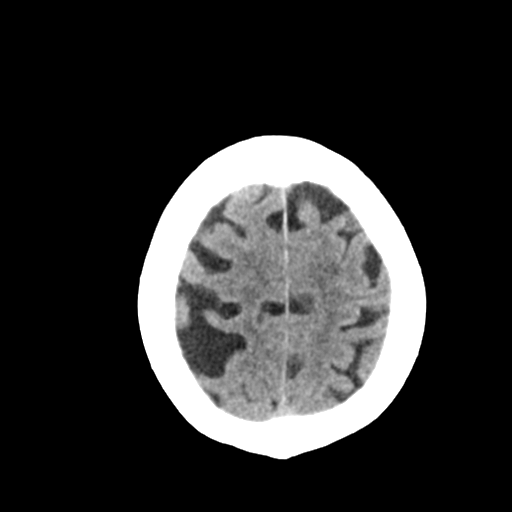
[im 26/32  brain]
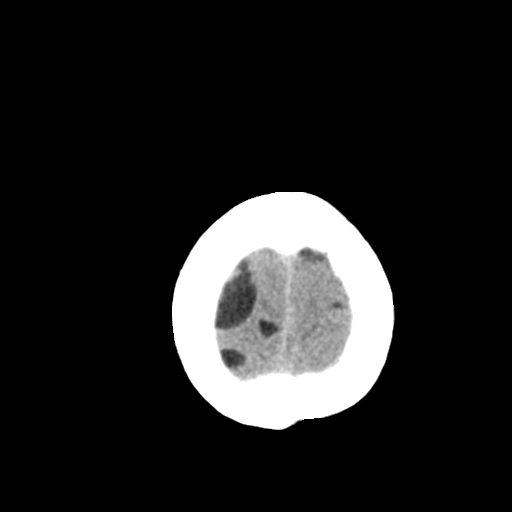
[im 29/32  brain]
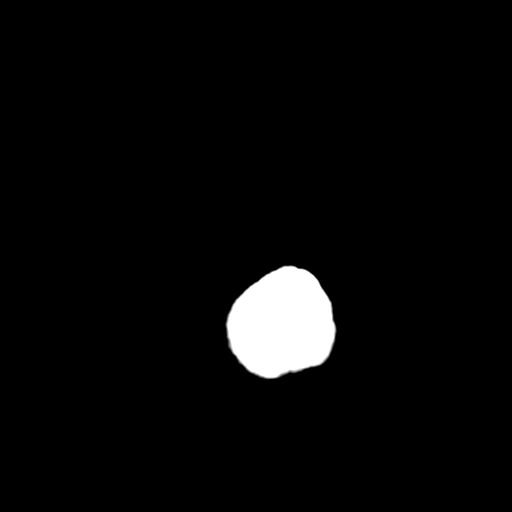
[im 29/32  bone]
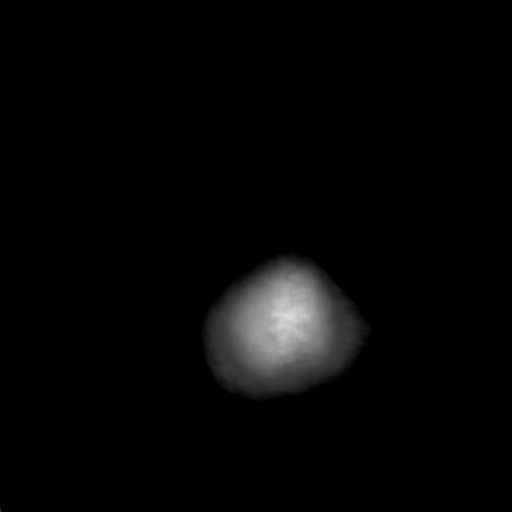

[Series 4: coronal soft tissue · coronal · 0.30mm/px · 3 of 55 slices shown]
[im 19/55  brain]
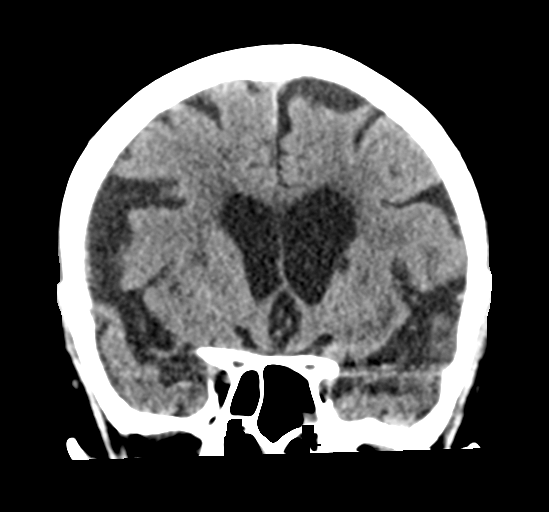
[im 25/55  brain]
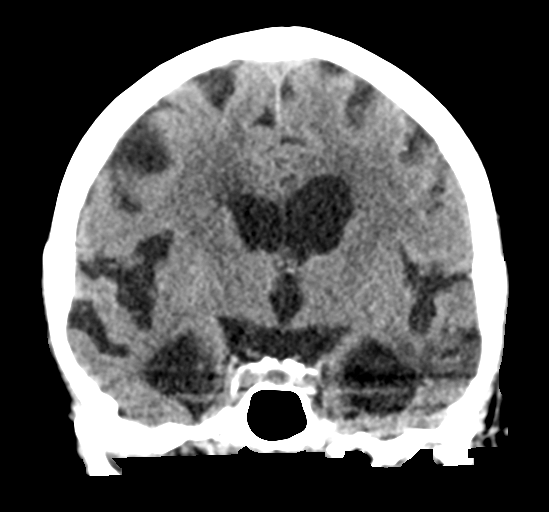
[im 31/55  brain]
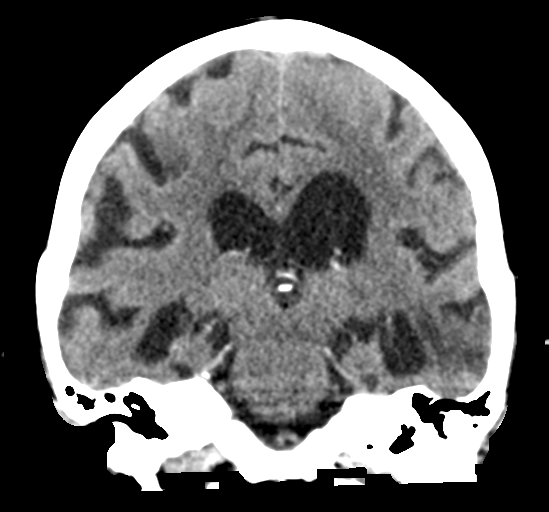

[Series 5: sagittal soft tissue · sagittal · 0.32mm/px · 3 of 47 slices shown]
[im 16/47  brain]
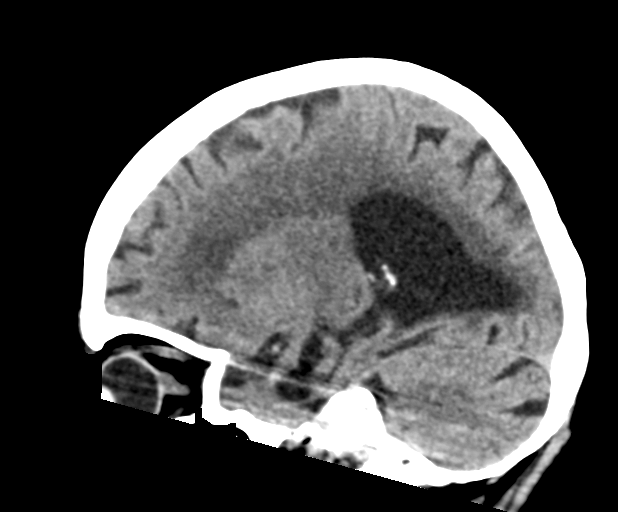
[im 24/47  brain]
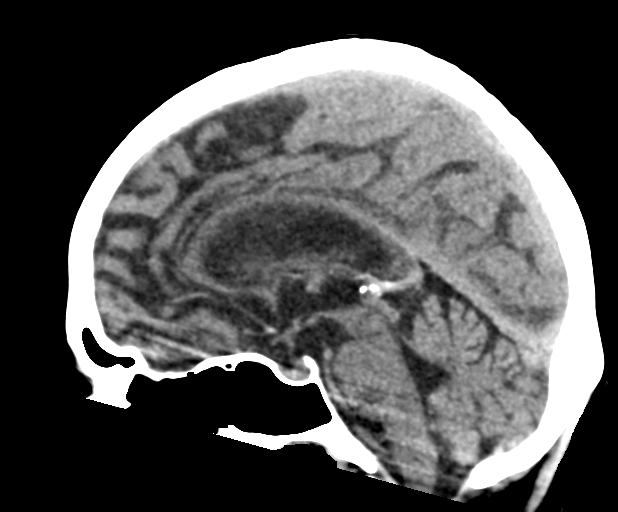
[im 31/47  brain]
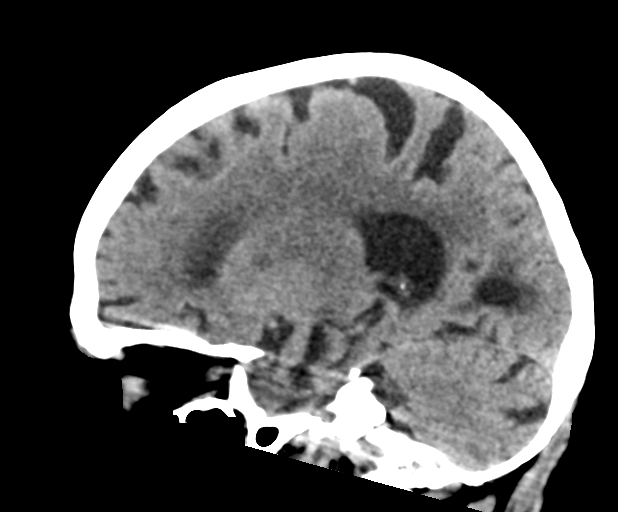

[15 of 47 positions shown; findings below may reference images not displayed]

FINDINGS: BRAIN: Blurring of the LEFT temporal parietal gray-white matter
differentiation with local sulcal effacement. Background of severe
ventriculomegaly with disproportionate temporal lobe volume loss
compatible with neurodegenerative disease. No intraparenchymal
hemorrhage, mass effect or midline shift. Old LEFT caudate head
lacunar infarct. Stable chronic small vessel ischemic disease. No
abnormal extra-axial fluid collections. Basal cisterns are patent.

VASCULAR: Mild calcific atherosclerosis of the carotid siphons.

SKULL: No skull fracture. No significant scalp soft tissue swelling.

SINUSES/ORBITS: The mastoid air-cells and included paranasal sinuses
are well-aerated.Soft tissue within the external auditory canals
most compatible with cerumen. The included ocular globes and orbital
contents are non-suspicious.

OTHER: None.
IMPRESSION: Acute to subacute moderate LEFT MCA territory nonhemorrhagic
infarct.

Chronic changes of neurodegenerative disease and, old LEFT caudate
head lacunar infarct.

Acute findings discussed with and reconfirmed by Dr.ELFINESH GIRMA DAALE
on 07/19/2016 at [DATE].

## 2020-03-04 DEATH — deceased
